# Patient Record
Sex: Male | Born: 1955 | Race: White | Hispanic: No | Marital: Married
Health system: Southern US, Community
[De-identification: ages and names within clinical notes are randomized; demographics above are authoritative.]

## PROBLEM LIST (undated history)

## (undated) DIAGNOSIS — E785 Hyperlipidemia, unspecified: Secondary | ICD-10-CM

## (undated) DIAGNOSIS — I10 Essential (primary) hypertension: Secondary | ICD-10-CM

## (undated) DIAGNOSIS — E669 Obesity, unspecified: Secondary | ICD-10-CM

## (undated) DIAGNOSIS — M549 Dorsalgia, unspecified: Secondary | ICD-10-CM

## (undated) DIAGNOSIS — G4731 Primary central sleep apnea: Secondary | ICD-10-CM

## (undated) DIAGNOSIS — E119 Type 2 diabetes mellitus without complications: Secondary | ICD-10-CM

## (undated) DIAGNOSIS — G4739 Other sleep apnea: Secondary | ICD-10-CM

## (undated) DIAGNOSIS — G43909 Migraine, unspecified, not intractable, without status migrainosus: Secondary | ICD-10-CM

## (undated) DIAGNOSIS — J189 Pneumonia, unspecified organism: Secondary | ICD-10-CM

## (undated) DIAGNOSIS — K219 Gastro-esophageal reflux disease without esophagitis: Secondary | ICD-10-CM

## (undated) HISTORY — DX: Dorsalgia, unspecified: M54.9

## (undated) HISTORY — PX: OTHER SURGICAL HISTORY: SHX169

## (undated) HISTORY — DX: Hyperlipidemia, unspecified: E78.5

## (undated) HISTORY — PX: LAMINECTOMY: SHX219

## (undated) HISTORY — DX: Obesity, unspecified: E66.9

## (undated) HISTORY — DX: Gastro-esophageal reflux disease without esophagitis: K21.9

## (undated) HISTORY — DX: Primary central sleep apnea: G47.31

## (undated) HISTORY — DX: Migraine, unspecified, not intractable, without status migrainosus: G43.909

## (undated) HISTORY — DX: Essential (primary) hypertension: I10

## (undated) HISTORY — DX: Pneumonia, unspecified organism: J18.9

## (undated) HISTORY — DX: Type 2 diabetes mellitus without complications: E11.9

## (undated) HISTORY — DX: Other sleep apnea: G47.39

---

## 2005-01-22 ENCOUNTER — Encounter: Admission: RE | Admit: 2005-01-22 | Discharge: 2005-01-22 | Payer: Self-pay | Admitting: Specialist

## 2010-11-03 ENCOUNTER — Encounter
Admission: RE | Admit: 2010-11-03 | Discharge: 2010-11-03 | Payer: Self-pay | Source: Home / Self Care | Attending: Specialist | Admitting: Specialist

## 2010-12-03 ENCOUNTER — Encounter: Payer: Self-pay | Admitting: Specialist

## 2011-02-02 ENCOUNTER — Other Ambulatory Visit (HOSPITAL_COMMUNITY): Payer: Self-pay

## 2011-02-02 ENCOUNTER — Ambulatory Visit (HOSPITAL_COMMUNITY)
Admission: RE | Admit: 2011-02-02 | Discharge: 2011-02-02 | Disposition: A | Discharge status: Home / Self Care | Payer: Managed Care, Other (non HMO) | Source: Ambulatory Visit | Attending: Specialist | Admitting: Specialist

## 2011-02-02 ENCOUNTER — Other Ambulatory Visit: Payer: Self-pay | Admitting: Specialist

## 2011-02-02 ENCOUNTER — Other Ambulatory Visit (HOSPITAL_COMMUNITY): Payer: Self-pay | Admitting: Specialist

## 2011-02-02 ENCOUNTER — Encounter (HOSPITAL_COMMUNITY): Payer: Managed Care, Other (non HMO)

## 2011-02-02 DIAGNOSIS — M48061 Spinal stenosis, lumbar region without neurogenic claudication: Secondary | ICD-10-CM

## 2011-02-02 DIAGNOSIS — Z01818 Encounter for other preprocedural examination: Secondary | ICD-10-CM

## 2011-02-02 DIAGNOSIS — Z01812 Encounter for preprocedural laboratory examination: Secondary | ICD-10-CM | POA: Insufficient documentation

## 2011-02-02 LAB — URINALYSIS, ROUTINE W REFLEX MICROSCOPIC
Glucose, UA: NEGATIVE mg/dL
Hgb urine dipstick: NEGATIVE
Specific Gravity, Urine: 1.036 — ABNORMAL HIGH (ref 1.005–1.030)

## 2011-02-02 LAB — COMPREHENSIVE METABOLIC PANEL
BUN: 14 mg/dL (ref 6–23)
CO2: 29 mEq/L (ref 19–32)
Calcium: 9.1 mg/dL (ref 8.4–10.5)
Chloride: 107 mEq/L (ref 96–112)
GFR calc Af Amer: >60 mL/min (ref >60)
Glucose, Bld: 118 mg/dL — ABNORMAL HIGH (ref 70–99)
Potassium: 4.2 mEq/L (ref 3.5–5.1)
Total Bilirubin: 0.1 mg/dL — ABNORMAL LOW (ref 0.3–1.2)
Total Protein: 6.6 g/dL (ref 6.0–8.3)

## 2011-02-02 LAB — PROTIME-INR: INR: 0.93 (ref 0.00–1.49)

## 2011-02-02 LAB — CBC
MCH: 29.2 pg (ref 26.0–34.0)
MCHC: 33.4 g/dL (ref 30.0–36.0)
MCV: 87.3 fL (ref 78.0–100.0)
Platelets: 268 10*3/uL (ref 150–400)
WBC: 8.5 10*3/uL (ref 4.0–10.5)

## 2011-02-02 LAB — SURGICAL PCR SCREEN: Staphylococcus aureus: NEGATIVE

## 2011-02-14 ENCOUNTER — Other Ambulatory Visit: Payer: Self-pay | Admitting: Specialist

## 2011-02-14 ENCOUNTER — Ambulatory Visit (HOSPITAL_COMMUNITY)
Admission: RE | Admit: 2011-02-14 | Discharge: 2011-02-14 | Disposition: A | Discharge status: Home / Self Care | Payer: Managed Care, Other (non HMO) | Source: Ambulatory Visit | Attending: Specialist | Admitting: Specialist

## 2011-02-14 ENCOUNTER — Ambulatory Visit (HOSPITAL_COMMUNITY): Payer: Managed Care, Other (non HMO)

## 2011-02-14 ENCOUNTER — Observation Stay (HOSPITAL_COMMUNITY)
Admission: RE | Admit: 2011-02-14 | Discharge: 2011-02-15 | Disposition: A | Discharge status: Home / Self Care | Payer: Managed Care, Other (non HMO) | Source: Ambulatory Visit | Attending: Specialist | Admitting: Specialist

## 2011-02-14 ENCOUNTER — Other Ambulatory Visit (HOSPITAL_COMMUNITY): Payer: Self-pay | Admitting: Specialist

## 2011-02-14 DIAGNOSIS — Z01818 Encounter for other preprocedural examination: Secondary | ICD-10-CM | POA: Insufficient documentation

## 2011-02-14 DIAGNOSIS — M5126 Other intervertebral disc displacement, lumbar region: Secondary | ICD-10-CM | POA: Insufficient documentation

## 2011-02-14 DIAGNOSIS — K219 Gastro-esophageal reflux disease without esophagitis: Secondary | ICD-10-CM | POA: Insufficient documentation

## 2011-02-14 DIAGNOSIS — Z9889 Other specified postprocedural states: Secondary | ICD-10-CM

## 2011-02-14 DIAGNOSIS — M48061 Spinal stenosis, lumbar region without neurogenic claudication: Principal | ICD-10-CM | POA: Insufficient documentation

## 2011-02-14 DIAGNOSIS — I10 Essential (primary) hypertension: Secondary | ICD-10-CM | POA: Insufficient documentation

## 2011-02-14 DIAGNOSIS — M79609 Pain in unspecified limb: Secondary | ICD-10-CM | POA: Insufficient documentation

## 2011-02-14 DIAGNOSIS — E669 Obesity, unspecified: Secondary | ICD-10-CM | POA: Insufficient documentation

## 2011-02-14 DIAGNOSIS — E785 Hyperlipidemia, unspecified: Secondary | ICD-10-CM | POA: Insufficient documentation

## 2011-02-28 NOTE — Op Note (Signed)
Raymond Powell, Raymond Powell               ACCOUNT NO.:  [id number]  MEDICAL RECORD NO.:  [id number]           PATIENT TYPE:  LOCATION:                                 FACILITY:  PHYSICIAN:  Jene Every, M.D.    DATE OF BIRTH:  [birth year]  DATE OF PROCEDURE:  02/14/2011 DATE OF DISCHARGE:                              OPERATIVE REPORT   PREOPERATIVE DIAGNOSES:  Spinal stenosis, herniated nucleus pulposus 4-5 bilaterally.  POSTOPERATIVE DIAGNOSES:  Spinal stenosis, herniated nucleus pulposus 4- 5 bilaterally.  PROCEDURE PERFORMED: 1. Bilateral hemilaminotomies, L4, L5. 2. Bilateral foraminotomies, L4, L5. 3. Microdiskectomy 4-5, left.  ANESTHESIA:  General.  ASSISTANT:  Roma Schanz, PA  BRIEF HISTORY:  A 55 year old with long history of left lower extremity radicular pain, right lower extremity radicular pain, second lateral recess stenosis, confirmed by myelogram.  Temporary relief from epidural steroid injection, positive neural tension sign, EHL weakness, indicated for decompression bilaterally.  Risks and benefits discussed, including bleeding, infection, damage to neurovascular structures, no change in symptoms, worsening symptoms, need for repeat debridement, DVT, PE, and anesthetic complications, etc.  TECHNIQUE:  With the patient in supine position after induction of adequate general anesthesia, 2 g of Kefzol, he was placed prone on the Deltana frame.  All bony prominences were well padded.  Lumbar region was prepped and draped in usual sterile fashion.  Two 18-gauge spinal needles were utilized to localize 4-5 interspace, confirmed with x-ray. Incision was made from spinous process 4-5, subcutaneous tissue was dissected, electrocautery was utilized to achieve hemostasis. Dorsolumbar fascia was divided on either line of the interspinous ligament.  Paraspinous muscle elevated from line of 4-5.  McCullough retractor was placed, from which one radiograph obtained.   There was a delay in the case due to the portal x-ray malfunctioning.  This was corrected and then we continued with the case.  Hemilaminotomy in the caudad edge at 4 was performed with 2 and 3 mm Kerrison on the left. Straight curette utilized to detach ligamentum flavum from the cephalad edge of 5.  Ligamentum flavum removed from the interspace with a Penfield 4 in the interlaminar space.  Severe lateral recess stenosis was noted multifactorial.  We decompressed lateral recess to medial border of the pedicle.  After we identified the L5 nerve root, gently mobilized this medially.  This was after a foraminotomy of 5 was performed as well. Lysis of adhesions was performed as well and a focal disk herniation was noted.  I performed annulotomy and then removed copious portion the herniated disk from the disk space.  Straight and an upbiting pituitary further mobilized with a hockey stick.  Foraminotomy of 4 was performed.  Hockey-stick probe passed freely up foramen of 4 and 5.  I checked beneath the thecal sac, the axilla root, shoulder root, foramen of 4 and 5, no residual disk herniation.  By approaching the root, we had at least 1 cm of excursion of the 5 root medial to pedicle without tension.  Disk space copiously irrigated with antibiotic irrigation.  Bipolar electrocautery was utilized to achieve hemostasis. We turned our attention to the right.  In  a similar fashion, we decompressed the lateral recess at L4-5 on the right.  There was no disk herniation.  We performed foraminotomies at 4 and 5, preserving the neural elements at all times.  Bipolar electrocautery was utilized to achieve hemostasis as well as adequate preservation of the pars bilaterally.  After the decompression on the right and foraminotomies, we checked of the left as well, no further herniated material.  Disk space was copiously irrigated, no evidence of CSF leakage or active bleeding.  Next, McCullough retractor  was placed after the [facility] 4 confirmed the interspace, the dorsolumbar fascia reapproximated with 1 Vicryl with interrupted figure-of-eight sutures, subcutaneous with 2-0 Vicryl simple sutures, and skin was reapproximated with 4-0 subcuticular Prolene.  Wound reinforced with Steri-Strips.  Sterile dressing applied. Placed in supine on hospital bed, extubated without difficulty, transported to the recovery room in satisfactory condition.  The patient tolerated procedure well.  No complications.     Jene Every, M.D.     Cordelia Pen  D:  02/14/2011  T:  02/15/2011  Job:  [id number]  Electronically Signed by Jene Every M.D. on 02/28/2011 12:04:56 PM

## 2011-06-10 NOTE — Discharge Summary (Signed)
  NAMEMATTEO, BANKE               ACCOUNT NO.:  [id number]  MEDICAL RECORD NO.:  [id number]  LOCATION:  1619                         FACILITY:  [facility]  PHYSICIAN:  Jene Every, M.D.    DATE OF BIRTH:  [birth year]  DATE OF ADMISSION:  02/14/2011 DATE OF DISCHARGE:  02/15/2011                              DISCHARGE SUMMARY   ADMISSION DIAGNOSIS:  Spinal stenosis, herniated nucleus pulposus L4-L5 bilaterally.  DISCHARGE DIAGNOSIS:  Spinal stenosis, herniated nucleus pulposus L4-L5 bilaterally, status post bilateral hemilaminotomies, foraminotomies at L4-L5 with microdiskectomy at L4-L5 on the left.  Hospital course was uneventful.  FOLLOWUP:  The patient will follow up with Dr. Shelle Iron in approximately 10 to 14 days.  Call for an appointment.  WOUND CARE:  Change dressing daily.  DIET:  As tolerated.  ACTIVITIES:  Walk as tolerated, utilizing back precautions.  CONDITION ON DISCHARGE:  Stable.  FINAL DIAGNOSIS:  Doing well status post lumbar decompression microdiskectomy at L4-L5.     Roma Schanz, P.A.   ______________________________ Jene Every, M.D.    CS/MEDQ  D:  05/28/2011  T:  05/28/2011  Job:  [id number]  Electronically Signed by Roma Schanz P.A. on 06/07/2011 02:59:27 PM Electronically Signed by Jene Every M.D. on 06/10/2011 09:49:53 AM

## 2012-04-16 DIAGNOSIS — M461 Sacroiliitis, not elsewhere classified: Secondary | ICD-10-CM

## 2012-04-16 DIAGNOSIS — M545 Low back pain, unspecified: Secondary | ICD-10-CM

## 2012-04-16 HISTORY — DX: Low back pain, unspecified: M54.50

## 2012-04-16 HISTORY — DX: Sacroiliitis, not elsewhere classified: M46.1

## 2012-06-17 DIAGNOSIS — G5700 Lesion of sciatic nerve, unspecified lower limb: Secondary | ICD-10-CM

## 2012-06-17 HISTORY — DX: Lesion of sciatic nerve, unspecified lower limb: G57.00

## 2012-10-01 DIAGNOSIS — M25519 Pain in unspecified shoulder: Secondary | ICD-10-CM | POA: Insufficient documentation

## 2012-10-01 DIAGNOSIS — M542 Cervicalgia: Secondary | ICD-10-CM

## 2012-10-01 DIAGNOSIS — G5690 Unspecified mononeuropathy of unspecified upper limb: Secondary | ICD-10-CM

## 2012-10-01 HISTORY — DX: Cervicalgia: M54.2

## 2012-10-01 HISTORY — DX: Unspecified mononeuropathy of unspecified upper limb: G56.90

## 2012-10-01 HISTORY — DX: Pain in unspecified shoulder: M25.519

## 2015-12-26 DIAGNOSIS — G894 Chronic pain syndrome: Secondary | ICD-10-CM | POA: Diagnosis not present

## 2015-12-26 DIAGNOSIS — Z79891 Long term (current) use of opiate analgesic: Secondary | ICD-10-CM | POA: Diagnosis not present

## 2015-12-26 DIAGNOSIS — M961 Postlaminectomy syndrome, not elsewhere classified: Secondary | ICD-10-CM | POA: Diagnosis not present

## 2015-12-26 DIAGNOSIS — Z79899 Other long term (current) drug therapy: Secondary | ICD-10-CM | POA: Diagnosis not present

## 2015-12-30 DIAGNOSIS — N200 Calculus of kidney: Secondary | ICD-10-CM | POA: Diagnosis not present

## 2016-01-02 DIAGNOSIS — N201 Calculus of ureter: Secondary | ICD-10-CM | POA: Diagnosis not present

## 2016-01-02 DIAGNOSIS — N200 Calculus of kidney: Secondary | ICD-10-CM | POA: Diagnosis not present

## 2016-01-03 DIAGNOSIS — N200 Calculus of kidney: Secondary | ICD-10-CM | POA: Diagnosis not present

## 2016-01-03 DIAGNOSIS — N201 Calculus of ureter: Secondary | ICD-10-CM | POA: Diagnosis not present

## 2016-01-04 DIAGNOSIS — R300 Dysuria: Secondary | ICD-10-CM | POA: Diagnosis not present

## 2016-01-04 DIAGNOSIS — N201 Calculus of ureter: Secondary | ICD-10-CM | POA: Diagnosis not present

## 2016-02-10 DIAGNOSIS — R7303 Prediabetes: Secondary | ICD-10-CM | POA: Diagnosis not present

## 2016-02-10 DIAGNOSIS — R21 Rash and other nonspecific skin eruption: Secondary | ICD-10-CM | POA: Diagnosis not present

## 2016-02-10 DIAGNOSIS — R632 Polyphagia: Secondary | ICD-10-CM | POA: Diagnosis not present

## 2016-03-20 DIAGNOSIS — G894 Chronic pain syndrome: Secondary | ICD-10-CM | POA: Diagnosis not present

## 2016-03-20 DIAGNOSIS — Z79891 Long term (current) use of opiate analgesic: Secondary | ICD-10-CM | POA: Diagnosis not present

## 2016-03-20 DIAGNOSIS — M961 Postlaminectomy syndrome, not elsewhere classified: Secondary | ICD-10-CM | POA: Diagnosis not present

## 2016-03-28 DIAGNOSIS — M961 Postlaminectomy syndrome, not elsewhere classified: Secondary | ICD-10-CM | POA: Diagnosis not present

## 2016-04-04 DIAGNOSIS — G894 Chronic pain syndrome: Secondary | ICD-10-CM | POA: Diagnosis not present

## 2016-04-04 DIAGNOSIS — M961 Postlaminectomy syndrome, not elsewhere classified: Secondary | ICD-10-CM | POA: Diagnosis not present

## 2016-04-04 DIAGNOSIS — Z79891 Long term (current) use of opiate analgesic: Secondary | ICD-10-CM | POA: Diagnosis not present

## 2016-06-18 DIAGNOSIS — E7800 Pure hypercholesterolemia, unspecified: Secondary | ICD-10-CM | POA: Diagnosis not present

## 2016-06-18 DIAGNOSIS — G43019 Migraine without aura, intractable, without status migrainosus: Secondary | ICD-10-CM | POA: Diagnosis not present

## 2016-06-18 DIAGNOSIS — I10 Essential (primary) hypertension: Secondary | ICD-10-CM | POA: Diagnosis not present

## 2016-06-18 DIAGNOSIS — K219 Gastro-esophageal reflux disease without esophagitis: Secondary | ICD-10-CM | POA: Diagnosis not present

## 2016-06-18 DIAGNOSIS — M5442 Lumbago with sciatica, left side: Secondary | ICD-10-CM | POA: Diagnosis not present

## 2016-07-19 DIAGNOSIS — E785 Hyperlipidemia, unspecified: Secondary | ICD-10-CM | POA: Diagnosis not present

## 2016-07-19 DIAGNOSIS — Z79899 Other long term (current) drug therapy: Secondary | ICD-10-CM | POA: Diagnosis not present

## 2016-07-30 DIAGNOSIS — M961 Postlaminectomy syndrome, not elsewhere classified: Secondary | ICD-10-CM | POA: Diagnosis not present

## 2016-07-30 DIAGNOSIS — Z79891 Long term (current) use of opiate analgesic: Secondary | ICD-10-CM | POA: Diagnosis not present

## 2016-07-30 DIAGNOSIS — G894 Chronic pain syndrome: Secondary | ICD-10-CM | POA: Diagnosis not present

## 2016-08-01 DIAGNOSIS — Z0000 Encounter for general adult medical examination without abnormal findings: Secondary | ICD-10-CM | POA: Diagnosis not present

## 2016-08-01 DIAGNOSIS — Z23 Encounter for immunization: Secondary | ICD-10-CM | POA: Diagnosis not present

## 2016-08-15 DIAGNOSIS — M961 Postlaminectomy syndrome, not elsewhere classified: Secondary | ICD-10-CM | POA: Diagnosis not present

## 2016-11-08 DIAGNOSIS — G894 Chronic pain syndrome: Secondary | ICD-10-CM | POA: Diagnosis not present

## 2016-11-08 DIAGNOSIS — M961 Postlaminectomy syndrome, not elsewhere classified: Secondary | ICD-10-CM | POA: Diagnosis not present

## 2016-11-08 DIAGNOSIS — Z79891 Long term (current) use of opiate analgesic: Secondary | ICD-10-CM | POA: Diagnosis not present

## 2016-12-04 DIAGNOSIS — M961 Postlaminectomy syndrome, not elsewhere classified: Secondary | ICD-10-CM | POA: Diagnosis not present

## 2016-12-22 DIAGNOSIS — M961 Postlaminectomy syndrome, not elsewhere classified: Secondary | ICD-10-CM | POA: Diagnosis not present

## 2016-12-22 DIAGNOSIS — G894 Chronic pain syndrome: Secondary | ICD-10-CM | POA: Diagnosis not present

## 2016-12-22 DIAGNOSIS — Z79891 Long term (current) use of opiate analgesic: Secondary | ICD-10-CM | POA: Diagnosis not present

## 2017-03-21 DIAGNOSIS — G894 Chronic pain syndrome: Secondary | ICD-10-CM | POA: Diagnosis not present

## 2017-03-21 DIAGNOSIS — Z79891 Long term (current) use of opiate analgesic: Secondary | ICD-10-CM | POA: Diagnosis not present

## 2017-03-21 DIAGNOSIS — M961 Postlaminectomy syndrome, not elsewhere classified: Secondary | ICD-10-CM | POA: Diagnosis not present

## 2017-05-20 DIAGNOSIS — K219 Gastro-esophageal reflux disease without esophagitis: Secondary | ICD-10-CM | POA: Diagnosis not present

## 2017-05-20 DIAGNOSIS — E7800 Pure hypercholesterolemia, unspecified: Secondary | ICD-10-CM | POA: Diagnosis not present

## 2017-05-20 DIAGNOSIS — E785 Hyperlipidemia, unspecified: Secondary | ICD-10-CM | POA: Diagnosis not present

## 2017-05-20 DIAGNOSIS — G8929 Other chronic pain: Secondary | ICD-10-CM | POA: Diagnosis not present

## 2017-05-20 DIAGNOSIS — R197 Diarrhea, unspecified: Secondary | ICD-10-CM | POA: Diagnosis not present

## 2017-05-20 DIAGNOSIS — J181 Lobar pneumonia, unspecified organism: Secondary | ICD-10-CM | POA: Diagnosis not present

## 2017-05-20 DIAGNOSIS — Z885 Allergy status to narcotic agent status: Secondary | ICD-10-CM | POA: Diagnosis not present

## 2017-05-20 DIAGNOSIS — Z79899 Other long term (current) drug therapy: Secondary | ICD-10-CM | POA: Diagnosis not present

## 2017-05-20 DIAGNOSIS — A419 Sepsis, unspecified organism: Secondary | ICD-10-CM | POA: Diagnosis not present

## 2017-05-20 DIAGNOSIS — J189 Pneumonia, unspecified organism: Secondary | ICD-10-CM | POA: Diagnosis not present

## 2017-05-20 DIAGNOSIS — I10 Essential (primary) hypertension: Secondary | ICD-10-CM | POA: Diagnosis not present

## 2017-05-20 DIAGNOSIS — R1084 Generalized abdominal pain: Secondary | ICD-10-CM | POA: Diagnosis not present

## 2017-05-20 DIAGNOSIS — M545 Low back pain: Secondary | ICD-10-CM | POA: Diagnosis not present

## 2017-05-20 DIAGNOSIS — R0902 Hypoxemia: Secondary | ICD-10-CM | POA: Diagnosis not present

## 2017-05-20 DIAGNOSIS — Z7982 Long term (current) use of aspirin: Secondary | ICD-10-CM | POA: Diagnosis not present

## 2017-05-20 DIAGNOSIS — A045 Campylobacter enteritis: Secondary | ICD-10-CM | POA: Diagnosis not present

## 2017-05-20 DIAGNOSIS — R109 Unspecified abdominal pain: Secondary | ICD-10-CM | POA: Diagnosis not present

## 2017-05-21 DIAGNOSIS — M545 Low back pain: Secondary | ICD-10-CM | POA: Diagnosis not present

## 2017-05-21 DIAGNOSIS — J189 Pneumonia, unspecified organism: Secondary | ICD-10-CM | POA: Diagnosis not present

## 2017-05-21 DIAGNOSIS — R109 Unspecified abdominal pain: Secondary | ICD-10-CM | POA: Diagnosis not present

## 2017-05-21 DIAGNOSIS — R0902 Hypoxemia: Secondary | ICD-10-CM | POA: Diagnosis not present

## 2017-05-21 DIAGNOSIS — E785 Hyperlipidemia, unspecified: Secondary | ICD-10-CM | POA: Diagnosis not present

## 2017-05-21 DIAGNOSIS — R197 Diarrhea, unspecified: Secondary | ICD-10-CM | POA: Diagnosis not present

## 2017-05-21 DIAGNOSIS — I10 Essential (primary) hypertension: Secondary | ICD-10-CM | POA: Diagnosis not present

## 2017-06-04 DIAGNOSIS — A045 Campylobacter enteritis: Secondary | ICD-10-CM | POA: Diagnosis not present

## 2017-06-04 DIAGNOSIS — J189 Pneumonia, unspecified organism: Secondary | ICD-10-CM | POA: Diagnosis not present

## 2017-06-04 DIAGNOSIS — J9601 Acute respiratory failure with hypoxia: Secondary | ICD-10-CM | POA: Diagnosis not present

## 2017-06-04 DIAGNOSIS — K219 Gastro-esophageal reflux disease without esophagitis: Secondary | ICD-10-CM | POA: Diagnosis not present

## 2017-06-04 DIAGNOSIS — E860 Dehydration: Secondary | ICD-10-CM | POA: Diagnosis not present

## 2017-07-23 DIAGNOSIS — G894 Chronic pain syndrome: Secondary | ICD-10-CM | POA: Diagnosis not present

## 2017-07-23 DIAGNOSIS — M961 Postlaminectomy syndrome, not elsewhere classified: Secondary | ICD-10-CM | POA: Diagnosis not present

## 2017-08-14 DIAGNOSIS — K219 Gastro-esophageal reflux disease without esophagitis: Secondary | ICD-10-CM | POA: Diagnosis not present

## 2017-08-14 DIAGNOSIS — Z131 Encounter for screening for diabetes mellitus: Secondary | ICD-10-CM | POA: Diagnosis not present

## 2017-08-14 DIAGNOSIS — E785 Hyperlipidemia, unspecified: Secondary | ICD-10-CM | POA: Diagnosis not present

## 2017-08-14 DIAGNOSIS — Z79899 Other long term (current) drug therapy: Secondary | ICD-10-CM | POA: Diagnosis not present

## 2017-08-14 DIAGNOSIS — Z23 Encounter for immunization: Secondary | ICD-10-CM | POA: Diagnosis not present

## 2017-08-14 DIAGNOSIS — Z0000 Encounter for general adult medical examination without abnormal findings: Secondary | ICD-10-CM | POA: Diagnosis not present

## 2017-08-14 DIAGNOSIS — Z125 Encounter for screening for malignant neoplasm of prostate: Secondary | ICD-10-CM | POA: Diagnosis not present

## 2017-08-14 DIAGNOSIS — C61 Malignant neoplasm of prostate: Secondary | ICD-10-CM | POA: Diagnosis not present

## 2017-08-22 DIAGNOSIS — G894 Chronic pain syndrome: Secondary | ICD-10-CM | POA: Diagnosis not present

## 2017-08-22 DIAGNOSIS — Z79891 Long term (current) use of opiate analgesic: Secondary | ICD-10-CM | POA: Diagnosis not present

## 2017-08-22 DIAGNOSIS — M961 Postlaminectomy syndrome, not elsewhere classified: Secondary | ICD-10-CM | POA: Diagnosis not present

## 2017-09-13 DIAGNOSIS — M25511 Pain in right shoulder: Secondary | ICD-10-CM | POA: Diagnosis not present

## 2017-11-18 DIAGNOSIS — M961 Postlaminectomy syndrome, not elsewhere classified: Secondary | ICD-10-CM | POA: Insufficient documentation

## 2017-11-18 DIAGNOSIS — Z79899 Other long term (current) drug therapy: Secondary | ICD-10-CM

## 2017-11-18 HISTORY — DX: Postlaminectomy syndrome, not elsewhere classified: M96.1

## 2017-11-18 HISTORY — DX: Other long term (current) drug therapy: Z79.899

## 2017-11-21 DIAGNOSIS — Z79899 Other long term (current) drug therapy: Secondary | ICD-10-CM | POA: Diagnosis not present

## 2017-11-21 DIAGNOSIS — M961 Postlaminectomy syndrome, not elsewhere classified: Secondary | ICD-10-CM | POA: Diagnosis not present

## 2017-11-21 DIAGNOSIS — M25512 Pain in left shoulder: Secondary | ICD-10-CM | POA: Insufficient documentation

## 2017-11-21 DIAGNOSIS — M5412 Radiculopathy, cervical region: Secondary | ICD-10-CM

## 2017-11-21 DIAGNOSIS — M25511 Pain in right shoulder: Secondary | ICD-10-CM

## 2017-11-21 HISTORY — DX: Pain in right shoulder: M25.511

## 2017-11-21 HISTORY — DX: Radiculopathy, cervical region: M54.12

## 2017-12-05 DIAGNOSIS — M5412 Radiculopathy, cervical region: Secondary | ICD-10-CM | POA: Diagnosis not present

## 2017-12-17 DIAGNOSIS — Z1331 Encounter for screening for depression: Secondary | ICD-10-CM | POA: Diagnosis not present

## 2017-12-17 DIAGNOSIS — Z0000 Encounter for general adult medical examination without abnormal findings: Secondary | ICD-10-CM | POA: Diagnosis not present

## 2017-12-17 DIAGNOSIS — Z1339 Encounter for screening examination for other mental health and behavioral disorders: Secondary | ICD-10-CM | POA: Diagnosis not present

## 2017-12-24 DIAGNOSIS — M5412 Radiculopathy, cervical region: Secondary | ICD-10-CM | POA: Diagnosis not present

## 2017-12-31 DIAGNOSIS — M5412 Radiculopathy, cervical region: Secondary | ICD-10-CM | POA: Diagnosis not present

## 2018-03-19 DIAGNOSIS — M5412 Radiculopathy, cervical region: Secondary | ICD-10-CM | POA: Diagnosis not present

## 2018-04-24 DIAGNOSIS — M5441 Lumbago with sciatica, right side: Secondary | ICD-10-CM | POA: Diagnosis not present

## 2018-04-24 DIAGNOSIS — M5442 Lumbago with sciatica, left side: Secondary | ICD-10-CM | POA: Diagnosis not present

## 2018-04-24 DIAGNOSIS — E119 Type 2 diabetes mellitus without complications: Secondary | ICD-10-CM | POA: Diagnosis not present

## 2018-04-24 DIAGNOSIS — K219 Gastro-esophageal reflux disease without esophagitis: Secondary | ICD-10-CM | POA: Diagnosis not present

## 2018-04-24 DIAGNOSIS — I10 Essential (primary) hypertension: Secondary | ICD-10-CM | POA: Diagnosis not present

## 2018-04-29 DIAGNOSIS — E119 Type 2 diabetes mellitus without complications: Secondary | ICD-10-CM | POA: Diagnosis not present

## 2018-06-11 DIAGNOSIS — H2513 Age-related nuclear cataract, bilateral: Secondary | ICD-10-CM | POA: Diagnosis not present

## 2018-07-22 DIAGNOSIS — Z79899 Other long term (current) drug therapy: Secondary | ICD-10-CM | POA: Diagnosis not present

## 2018-08-26 DIAGNOSIS — Z23 Encounter for immunization: Secondary | ICD-10-CM | POA: Diagnosis not present

## 2018-09-04 DIAGNOSIS — K219 Gastro-esophageal reflux disease without esophagitis: Secondary | ICD-10-CM | POA: Diagnosis not present

## 2018-09-04 DIAGNOSIS — E7800 Pure hypercholesterolemia, unspecified: Secondary | ICD-10-CM | POA: Diagnosis not present

## 2018-09-04 DIAGNOSIS — E119 Type 2 diabetes mellitus without complications: Secondary | ICD-10-CM | POA: Diagnosis not present

## 2018-09-24 DIAGNOSIS — L820 Inflamed seborrheic keratosis: Secondary | ICD-10-CM | POA: Diagnosis not present

## 2018-09-24 DIAGNOSIS — B079 Viral wart, unspecified: Secondary | ICD-10-CM | POA: Diagnosis not present

## 2018-09-24 DIAGNOSIS — L578 Other skin changes due to chronic exposure to nonionizing radiation: Secondary | ICD-10-CM | POA: Diagnosis not present

## 2018-11-27 DIAGNOSIS — Z79899 Other long term (current) drug therapy: Secondary | ICD-10-CM | POA: Diagnosis not present

## 2018-11-27 DIAGNOSIS — M5412 Radiculopathy, cervical region: Secondary | ICD-10-CM | POA: Diagnosis not present

## 2018-11-27 DIAGNOSIS — M961 Postlaminectomy syndrome, not elsewhere classified: Secondary | ICD-10-CM | POA: Diagnosis not present

## 2019-01-13 DIAGNOSIS — E119 Type 2 diabetes mellitus without complications: Secondary | ICD-10-CM | POA: Diagnosis not present

## 2019-01-13 DIAGNOSIS — G43019 Migraine without aura, intractable, without status migrainosus: Secondary | ICD-10-CM | POA: Diagnosis not present

## 2019-01-13 DIAGNOSIS — E785 Hyperlipidemia, unspecified: Secondary | ICD-10-CM | POA: Diagnosis not present

## 2019-01-13 DIAGNOSIS — K219 Gastro-esophageal reflux disease without esophagitis: Secondary | ICD-10-CM | POA: Diagnosis not present

## 2019-01-13 DIAGNOSIS — I10 Essential (primary) hypertension: Secondary | ICD-10-CM | POA: Diagnosis not present

## 2019-01-13 DIAGNOSIS — Z0000 Encounter for general adult medical examination without abnormal findings: Secondary | ICD-10-CM | POA: Diagnosis not present

## 2019-01-13 DIAGNOSIS — Z79899 Other long term (current) drug therapy: Secondary | ICD-10-CM | POA: Diagnosis not present

## 2019-03-26 DIAGNOSIS — G894 Chronic pain syndrome: Secondary | ICD-10-CM | POA: Diagnosis not present

## 2019-03-26 DIAGNOSIS — M545 Low back pain: Secondary | ICD-10-CM | POA: Diagnosis not present

## 2019-07-16 DIAGNOSIS — I10 Essential (primary) hypertension: Secondary | ICD-10-CM | POA: Diagnosis not present

## 2019-07-16 DIAGNOSIS — E119 Type 2 diabetes mellitus without complications: Secondary | ICD-10-CM | POA: Diagnosis not present

## 2019-07-16 DIAGNOSIS — E7800 Pure hypercholesterolemia, unspecified: Secondary | ICD-10-CM | POA: Diagnosis not present

## 2019-07-16 DIAGNOSIS — M5442 Lumbago with sciatica, left side: Secondary | ICD-10-CM | POA: Diagnosis not present

## 2019-07-29 DIAGNOSIS — M961 Postlaminectomy syndrome, not elsewhere classified: Secondary | ICD-10-CM | POA: Diagnosis not present

## 2019-07-29 DIAGNOSIS — Z5181 Encounter for therapeutic drug level monitoring: Secondary | ICD-10-CM | POA: Diagnosis not present

## 2019-07-29 DIAGNOSIS — M5412 Radiculopathy, cervical region: Secondary | ICD-10-CM | POA: Diagnosis not present

## 2019-07-29 DIAGNOSIS — Z79899 Other long term (current) drug therapy: Secondary | ICD-10-CM | POA: Diagnosis not present

## 2019-09-02 DIAGNOSIS — H2513 Age-related nuclear cataract, bilateral: Secondary | ICD-10-CM | POA: Diagnosis not present

## 2019-11-23 DIAGNOSIS — R002 Palpitations: Secondary | ICD-10-CM | POA: Diagnosis not present

## 2019-11-23 DIAGNOSIS — E119 Type 2 diabetes mellitus without complications: Secondary | ICD-10-CM | POA: Diagnosis not present

## 2019-11-26 DIAGNOSIS — Z79899 Other long term (current) drug therapy: Secondary | ICD-10-CM | POA: Diagnosis not present

## 2019-11-26 DIAGNOSIS — M5412 Radiculopathy, cervical region: Secondary | ICD-10-CM | POA: Diagnosis not present

## 2019-11-26 DIAGNOSIS — M961 Postlaminectomy syndrome, not elsewhere classified: Secondary | ICD-10-CM | POA: Diagnosis not present

## 2019-12-11 DIAGNOSIS — R002 Palpitations: Secondary | ICD-10-CM | POA: Diagnosis not present

## 2019-12-12 ENCOUNTER — Encounter: Payer: Self-pay | Admitting: Cardiology

## 2019-12-12 NOTE — Progress Notes (Signed)
Cardiology Office Note:    Date:  12/14/2019   ID:  Raymond Powell, DOB December 25, 1955, MRN [id number]  PCP:  Serita Grammes, MD  Cardiologist:  Shirlee More, MD   Referring MD: Serita Grammes, MD  ASSESSMENT:    1. Palpitations   2. Hyperlipidemia, unspecified hyperlipidemia type   3. Migraine without status migrainosus, not intractable, unspecified migraine type    PLAN:    In order of problems listed above:  1. I am still awaiting the results of his monitor I would leave this note open I expect to receive it with the next 24 hours and told the patient I would call him and will finish the office visit at that time.  The treatment depends on precise delineation of arrhythmia if present in which type patient he is having atrial fibrillation.  If present would be low stroke risk based on age  I received his monitor reviewed independently he wore it for 9 days 20 hours beginning 11/23/2019.  He had documented atrial fibrillation 7% burden longest episode 9 hours 6 minutes daytime heart rates tended to be rapid 61% of the time above 110 bpm.  Ventricular arrhythmia was rare with PVCs couplets.  There was an episode called ventricular tachycardia but was rapid atrial fibrillation.  We discussed on the phone on when to start him on low-dose flecainide 50 mg twice daily stop aspirin start Eliquis 5 mg twice daily I will ask him to have an echocardiogram performed and will move his follow-up in the office to 1 week.  Will need EKG at that time.  2. Continue statin 3. Continue beta-blocker at this time  Next appointment 6 weeks   Medication Adjustments/Labs and Tests Ordered: Current medicines are reviewed at length with the patient today.  Concerns regarding medicines are outlined above.  No orders of the defined types were placed in this encounter.  No orders of the defined types were placed in this encounter.    Chief Complaint  Patient presents with  . Palpitations    History  of Present Illness:    Raymond Powell is a 64 y.o. male who is being seen today for the evaluation of palpitation at the request of Serita Grammes, MD.  Recently he has had worsened palpitation.  About 1 time a week he gets episodes where his heart beats irregularly and makes him feel little breathless and afterwards he feels weak and the episodes last for hours at a time up to 12 hours.  Recently these episodes have been worsened saw his primary care physician he wore an ambulatory heart rhythm monitor for 10 days results pending.  Put a blood pressure cuff on and the rate is not rapid but he gets a signal that is irregular and he is on a beta-blocker for migraine prophylaxis.  His mother had atrial fibrillation and stroke.  He has no history of heart murmur congenital rheumatic heart disease and has no document arrhythmia.  He does use over-the-counter sinus allergy medications but does not think the symptoms are related.  Stroke or TIA. Past Medical History:  Diagnosis Date  . Back pain   . GERD without esophagitis   . Hyperlipidemia   . Hypertension   . Migraine   . Obesity   . Pneumonia   . Type 2 diabetes mellitus ([place])     Past Surgical History:  Procedure Laterality Date  . Arthroscopic knee surgery    . LAMINECTOMY      Current Medications: Current  Meds  Medication Sig  . aspirin 81 MG EC tablet Take by mouth.  Marland Kitchen atenolol (TENORMIN) 50 MG tablet atenolol 50 mg tablet  . atorvastatin (LIPITOR) 40 MG tablet atorvastatin 40 mg tablet  . meloxicam (MOBIC) 15 MG tablet meloxicam 15 mg tablet  . oxyCODONE-acetaminophen (PERCOCET/ROXICET) 5-325 MG tablet Take by mouth.     Allergies:   Codeine   Social History   Socioeconomic History  . Marital status: Married    Spouse name: Not on file  . Number of children: Not on file  . Years of education: Not on file  . Highest education level: Not on file  Occupational History  . Not on file  Tobacco Use  . Smoking status:  Former Smoker    Quit date: 12/11/1988    Years since quitting: 31.0  . Smokeless tobacco: Never Used  Substance and Sexual Activity  . Alcohol use: Never  . Drug use: Never  . Sexual activity: Yes  Other Topics Concern  . Not on file  Social History Narrative  . Not on file   Social Determinants of Health   Financial Resource Strain:   . Difficulty of Paying Living Expenses: Not on file  Food Insecurity:   . Worried About Charity fundraiser in the Last Year: Not on file  . Ran Out of Food in the Last Year: Not on file  Transportation Needs:   . Lack of Transportation (Medical): Not on file  . Lack of Transportation (Non-Medical): Not on file  Physical Activity:   . Days of Exercise per Week: Not on file  . Minutes of Exercise per Session: Not on file  Stress:   . Feeling of Stress : Not on file  Social Connections:   . Frequency of Communication with Friends and Family: Not on file  . Frequency of Social Gatherings with Friends and Family: Not on file  . Attends Religious Services: Not on file  . Active Member of Clubs or Organizations: Not on file  . Attends Archivist Meetings: Not on file  . Marital Status: Not on file     Family History: The patient's family history includes CAD in his mother; Ovarian cancer in his sister; Pancreatic cancer in his brother; Stroke in his mother.  ROS:   Review of Systems  Constitution: Negative.  HENT: Negative.   Eyes: Negative.   Cardiovascular: Positive for irregular heartbeat and palpitations.  Respiratory: Negative.   Endocrine: Negative.   Hematologic/Lymphatic: Negative.   Skin: Positive for rash.  Musculoskeletal: Positive for back pain.  Gastrointestinal: Negative.   Genitourinary: Negative.   Neurological: Positive for headaches.  Psychiatric/Behavioral: Negative.   Allergic/Immunologic: Negative.    Please see the history of present illness.    He had a rash where the monitor was applied to his chest  all other systems reviewed and are negative.  EKGs/Labs/Other Studies Reviewed:    The following studies were reviewed today:  EKG:  EKG 11/22/2018 reviewed and demonstrates sinus rhythm left axis deviation low voltage precordial leads normal QT interval  Recent Labs: 01/13/2019: Cholesterol 204 HDL 46 LDL 132 A1c 6.5% TSH normal  Physical Exam:    VS:  BP 118/90   Pulse 79   Ht 6\' 1"  (1.854 m)   Wt 283 lb (128.4 kg)   SpO2 95%   BMI 37.34 kg/m     Wt Readings from Last 3 Encounters:  12/14/19 283 lb (128.4 kg)     GEN: Obese well  nourished, well developed in no acute distress HEENT: Normal NECK: No JVD; No carotid bruits LYMPHATICS: No lymphadenopathy CARDIAC: RRR, no murmurs, rubs, gallops RESPIRATORY:  Clear to auscultation without rales, wheezing or rhonchi  ABDOMEN: Soft, non-tender, non-distended MUSCULOSKELETAL:  No edema; No deformity  SKIN: Warm and dry NEUROLOGIC:  Alert and oriented x 3 PSYCHIATRIC:  Normal affect     Signed, Shirlee More, MD  12/14/2019 3:42 PM    [place]

## 2019-12-14 ENCOUNTER — Ambulatory Visit (INDEPENDENT_AMBULATORY_CARE_PROVIDER_SITE_OTHER): Payer: Medicare Other | Admitting: Cardiology

## 2019-12-14 ENCOUNTER — Other Ambulatory Visit: Payer: Self-pay

## 2019-12-14 ENCOUNTER — Encounter: Payer: Self-pay | Admitting: Cardiology

## 2019-12-14 VITALS — BP 118/90 | HR 79 | Ht 73.0 in | Wt 283.0 lb

## 2019-12-14 DIAGNOSIS — G43909 Migraine, unspecified, not intractable, without status migrainosus: Secondary | ICD-10-CM

## 2019-12-14 DIAGNOSIS — E785 Hyperlipidemia, unspecified: Secondary | ICD-10-CM | POA: Diagnosis not present

## 2019-12-14 DIAGNOSIS — I480 Paroxysmal atrial fibrillation: Secondary | ICD-10-CM

## 2019-12-14 DIAGNOSIS — R002 Palpitations: Secondary | ICD-10-CM | POA: Diagnosis not present

## 2019-12-14 DIAGNOSIS — R9431 Abnormal electrocardiogram [ECG] [EKG]: Secondary | ICD-10-CM

## 2019-12-14 MED ORDER — APIXABAN 5 MG PO TABS: 5.0000 mg | ORAL_TABLET | Freq: Two times a day (BID) | ORAL | 3 refills | Status: DC

## 2019-12-14 MED ORDER — FLECAINIDE ACETATE 50 MG PO TABS
50.0000 mg | ORAL_TABLET | Freq: Two times a day (BID) | ORAL | 3 refills | Status: DC
Start: 2019-12-14 — End: 2020-02-01

## 2019-12-14 NOTE — Patient Instructions (Signed)
Medication Instructions:  Your physician recommends that you continue on your current medications as directed. Please refer to the Current Medication list given to you today.  *If you need a refill on your cardiac medications before your next appointment, please call your pharmacy*  Lab Work: NONE If you have labs (blood work) drawn today and your tests are completely normal, you will receive your results only by: Marland Kitchen MyChart Message (if you have MyChart) OR . A paper copy in the mail If you have any lab test that is abnormal or we need to change your treatment, we will call you to review the results.  Testing/Procedures: NONE  Follow-Up: At [facility], you and your health needs are our priority.  As part of our continuing mission to provide you with exceptional heart care, we have created designated Provider Care Teams.  These Care Teams include your primary Cardiologist (physician) and Advanced Practice Providers (APPs -  Physician Assistants and Nurse Practitioners) who all work together to provide you with the care you need, when you need it.  Your next appointment:   6 week(s)  The format for your next appointment:   In Person  Provider:   Shirlee More, MD

## 2019-12-15 NOTE — Addendum Note (Signed)
Addended by: Beckey Rutter on: 12/15/2019 09:43 AM   Modules accepted: Orders

## 2019-12-16 ENCOUNTER — Other Ambulatory Visit (HOSPITAL_BASED_OUTPATIENT_CLINIC_OR_DEPARTMENT_OTHER): Payer: Medicare Other

## 2019-12-17 ENCOUNTER — Ambulatory Visit (HOSPITAL_BASED_OUTPATIENT_CLINIC_OR_DEPARTMENT_OTHER)
Admission: RE | Admit: 2019-12-17 | Discharge: 2019-12-17 | Disposition: A | Discharge status: Home / Self Care | Payer: Medicare Other | Source: Ambulatory Visit | Attending: Cardiology | Admitting: Cardiology

## 2019-12-17 ENCOUNTER — Other Ambulatory Visit: Payer: Self-pay

## 2019-12-17 DIAGNOSIS — R9431 Abnormal electrocardiogram [ECG] [EKG]: Secondary | ICD-10-CM | POA: Insufficient documentation

## 2019-12-17 DIAGNOSIS — I480 Paroxysmal atrial fibrillation: Secondary | ICD-10-CM | POA: Insufficient documentation

## 2019-12-17 DIAGNOSIS — R002 Palpitations: Secondary | ICD-10-CM | POA: Diagnosis not present

## 2019-12-17 DIAGNOSIS — E785 Hyperlipidemia, unspecified: Secondary | ICD-10-CM | POA: Insufficient documentation

## 2019-12-17 NOTE — Progress Notes (Signed)
  Echocardiogram 2D Echocardiogram has been performed.  Raymond Powell 12/17/2019, 2:48 PM

## 2019-12-17 NOTE — Progress Notes (Signed)
  Echocardiogram 2D Echocardiogram has been performed.  Rene Lardy 12/17/2019, 2:48 PM

## 2019-12-21 DIAGNOSIS — E119 Type 2 diabetes mellitus without complications: Secondary | ICD-10-CM | POA: Diagnosis not present

## 2019-12-21 DIAGNOSIS — I4891 Unspecified atrial fibrillation: Secondary | ICD-10-CM | POA: Diagnosis not present

## 2020-01-31 NOTE — Progress Notes (Signed)
Cardiology Office Note:    Date:  02/01/2020   ID:  Raymond Powell, DOB July 17, 1956, MRN [id number]  PCP:  Serita Grammes, MD  Cardiologist:  Shirlee More, MD    Referring MD: Serita Grammes, MD    ASSESSMENT:    1. High risk medication use   2. PAF (paroxysmal atrial fibrillation) ([place])   3. Chronic anticoagulation    PLAN:    In order of problems listed above:  1. Flecainide low-dose continue the same we will do an EKG to screen for toxicity before leaving the office continue his anticoagulant beta-blocker.   Next appointment: 6Months   Medication Adjustments/Labs and Tests Ordered: Current medicines are reviewed at length with the patient today.  Concerns regarding medicines are outlined above.  Orders Placed This Encounter  Procedures  . EKG 12-Lead   No orders of the defined types were placed in this encounter.   Chief Complaint  Patient presents with  . Atrial Fibrillation    Start on flecainide and anticoagulant    History of Present Illness:    Raymond Powell is a 64 y.o. male with a hx of asthma and atrial fibrillation last seen 12/14/2019.  He was initiated on low-dose flecainide 50 mg twice daily and transition from aspirin to full dose Eliquis.  12/17/2019 showed ejection fraction 60 to 65% right ventricular function both atria were normal in size and there was no significant valvular abnormality. Compliance with diet, lifestyle and medications: Yes  In general he is doing well he had one brief episode of tachycardia but was only for 1 minute not sustained he tolerates his anticoagulant without bleeding no edema shortness of breath chest pain or syncope. Past Medical History:  Diagnosis Date  . Back pain   . Cervical radiculopathy 11/21/2017  . GERD without esophagitis   . Hyperlipidemia   . Hypertension   . Low back pain 04/16/2012  . Lumbar post-laminectomy syndrome 11/18/2017  . Migraine   . Neck pain 10/01/2012  . 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 Obesity   . On long term drug therapy 11/18/2017  . Pain of right shoulder joint on movement 11/21/2017  . Piriformis syndrome 06/17/2012  . Pneumonia   . Sacroiliitis, not elsewhere classified ([place]) 04/16/2012  . Shoulder pain 10/01/2012  . Type 2 diabetes mellitus ([place])     Past Surgical History:  Procedure Laterality Date  . Arthroscopic knee surgery    . LAMINECTOMY      Current Medications: Current Meds  Medication Sig  . apixaban (ELIQUIS) 5 MG TABS tablet Take 1 tablet (5 mg total) by mouth 2 (two) times daily.  Marland Kitchen atenolol (TENORMIN) 50 MG tablet atenolol 50 mg tablet  . atorvastatin (LIPITOR) 40 MG tablet atorvastatin 40 mg tablet  . flecainide (TAMBOCOR) 50 MG tablet Take 1 tablet (50 mg total) by mouth 2 (two) times daily.  . meloxicam (MOBIC) 15 MG tablet meloxicam 15 mg tablet  . oxyCODONE-acetaminophen (PERCOCET) 10-325 MG tablet Take 1 tablet by mouth 5 (five) times daily as needed.     Allergies:   Codeine   Social History   Socioeconomic History  . Marital status: Married    Spouse name: Not on file  . Number of children: Not on file  . Years of education: Not on file  . Highest education level: Not on file  Occupational History  . Not on file  Tobacco Use  . Smoking status: Former Smoker    Quit date: 12/11/1988  Years since quitting: 31.1  . Smokeless tobacco: Never Used  Substance and Sexual Activity  . Alcohol use: Never  . Drug use: Never  . Sexual activity: Yes  Other Topics Concern  . Not on file  Social History Narrative  . Not on file   Social Determinants of Health   Financial Resource Strain:   . Difficulty of Paying Living Expenses:   Food Insecurity:   . Worried About Charity fundraiser in the Last Year:   . Arboriculturist in the Last Year:   Transportation Needs:   . Film/video editor (Medical):   Marland Kitchen Lack of Transportation (Non-Medical):   Physical Activity:   . Days of Exercise per Week:   .  Minutes of Exercise per Session:   Stress:   . Feeling of Stress :   Social Connections:   . Frequency of Communication with Friends and Family:   . Frequency of Social Gatherings with Friends and Family:   . Attends Religious Services:   . Active Member of Clubs or Organizations:   . Attends Archivist Meetings:   Marland Kitchen Marital Status:      Family History: The patient's family history includes CAD in his mother; Ovarian cancer in his sister; Pancreatic cancer in his brother; Stroke in his mother. ROS:   Please see the history of present illness.    All other systems reviewed and are negative.  EKGs/Labs/Other Studies Reviewed:    The following studies were reviewed today:  EKG:  EKG ordered today and personally reviewed.  The ekg ordered today demonstrates sinus rhythm normal EKG except for left axis deviation  Recent Labs: No results found for requested labs within last 8760 hours.  Recent Lipid Panel No results found for: CHOL, TRIG, HDL, CHOLHDL, VLDL, LDLCALC, LDLDIRECT  Physical Exam:    VS:  BP 114/80   Pulse 95   Ht 6\' 1"  (D34-534 m)   Wt 283 lb (128.4 kg)   SpO2 95%   BMI 37.34 kg/m     Wt Readings from Last 3 Encounters:  02/01/20 283 lb (128.4 kg)  12/14/19 283 lb (128.4 kg)     GEN:  Well nourished, well developed in no acute distress HEENT: Normal NECK: No JVD; No carotid bruits LYMPHATICS: No lymphadenopathy CARDIAC: RRR, no murmurs, rubs, gallops RESPIRATORY:  Clear to auscultation without rales, wheezing or rhonchi  ABDOMEN: Soft, non-tender, non-distended MUSCULOSKELETAL:  No edema; No deformity  SKIN: Warm and dry NEUROLOGIC:  Alert and oriented x 3 PSYCHIATRIC:  Normal affect    Signed, Shirlee More, MD  02/01/2020 3:43 PM    [facility]

## 2020-02-01 ENCOUNTER — Encounter: Payer: Self-pay | Admitting: Cardiology

## 2020-02-01 ENCOUNTER — Other Ambulatory Visit: Payer: Self-pay

## 2020-02-01 ENCOUNTER — Ambulatory Visit: Payer: Medicare Other | Admitting: Cardiology

## 2020-02-01 VITALS — BP 114/80 | HR 95 | Ht 73.0 in | Wt 283.0 lb

## 2020-02-01 DIAGNOSIS — I480 Paroxysmal atrial fibrillation: Secondary | ICD-10-CM | POA: Diagnosis not present

## 2020-02-01 DIAGNOSIS — Z7901 Long term (current) use of anticoagulants: Secondary | ICD-10-CM

## 2020-02-01 DIAGNOSIS — R002 Palpitations: Secondary | ICD-10-CM | POA: Diagnosis not present

## 2020-02-01 DIAGNOSIS — Z79899 Other long term (current) drug therapy: Secondary | ICD-10-CM

## 2020-02-01 MED ORDER — ATENOLOL 50 MG PO TABS: 50.0000 mg | ORAL_TABLET | Freq: Two times a day (BID) | ORAL | 3 refills | Status: DC

## 2020-02-01 MED ORDER — ATORVASTATIN CALCIUM 40 MG PO TABS: ORAL_TABLET | ORAL | 3 refills | Status: DC

## 2020-02-01 MED ORDER — APIXABAN 5 MG PO TABS: 5.0000 mg | ORAL_TABLET | Freq: Two times a day (BID) | ORAL | 3 refills | Status: DC

## 2020-02-01 MED ORDER — FLECAINIDE ACETATE 50 MG PO TABS: 50.0000 mg | ORAL_TABLET | Freq: Two times a day (BID) | ORAL | 3 refills | Status: DC

## 2020-02-01 NOTE — Patient Instructions (Signed)

## 2020-02-02 DIAGNOSIS — Z79899 Other long term (current) drug therapy: Secondary | ICD-10-CM | POA: Diagnosis not present

## 2020-02-02 DIAGNOSIS — M961 Postlaminectomy syndrome, not elsewhere classified: Secondary | ICD-10-CM | POA: Diagnosis not present

## 2020-02-02 DIAGNOSIS — M5412 Radiculopathy, cervical region: Secondary | ICD-10-CM | POA: Diagnosis not present

## 2020-02-10 ENCOUNTER — Other Ambulatory Visit: Payer: Self-pay

## 2020-02-10 NOTE — Telephone Encounter (Signed)
Spoke with Raymond Powell (Seymour's wife) confirming he takes Atorvastatin 40 one time a day.  Called in the directions to [facility].

## 2020-03-11 DIAGNOSIS — K219 Gastro-esophageal reflux disease without esophagitis: Secondary | ICD-10-CM | POA: Diagnosis not present

## 2020-03-11 DIAGNOSIS — Z79899 Other long term (current) drug therapy: Secondary | ICD-10-CM | POA: Diagnosis not present

## 2020-03-11 DIAGNOSIS — M545 Low back pain: Secondary | ICD-10-CM | POA: Diagnosis not present

## 2020-03-11 DIAGNOSIS — I10 Essential (primary) hypertension: Secondary | ICD-10-CM | POA: Diagnosis not present

## 2020-03-23 DIAGNOSIS — E119 Type 2 diabetes mellitus without complications: Secondary | ICD-10-CM | POA: Diagnosis not present

## 2020-03-23 DIAGNOSIS — Z9181 History of falling: Secondary | ICD-10-CM | POA: Diagnosis not present

## 2020-03-30 ENCOUNTER — Telehealth: Payer: Self-pay | Admitting: Cardiology

## 2020-03-30 ENCOUNTER — Other Ambulatory Visit: Payer: Self-pay | Admitting: Cardiology

## 2020-03-30 DIAGNOSIS — R002 Palpitations: Secondary | ICD-10-CM

## 2020-03-30 DIAGNOSIS — I480 Paroxysmal atrial fibrillation: Secondary | ICD-10-CM

## 2020-03-30 DIAGNOSIS — M5412 Radiculopathy, cervical region: Secondary | ICD-10-CM

## 2020-03-30 DIAGNOSIS — G5690 Unspecified mononeuropathy of unspecified upper limb: Secondary | ICD-10-CM

## 2020-03-30 MED ORDER — FLECAINIDE ACETATE 50 MG PO TABS: 50.0000 mg | ORAL_TABLET | Freq: Two times a day (BID) | ORAL | 3 refills | Status: DC

## 2020-03-30 NOTE — Telephone Encounter (Signed)
Pt c/o medication issue:  1. Name of Medication: apixaban (ELIQUIS) 5 MG TABS tablet  2. How are you currently taking this medication (dosage and times per day)? As directed   3. Are you having a reaction (difficulty breathing--STAT)? price  4. What is your medication issue? Medication is too expensive. Pt would like to be given an rx for a generic version if possible. Pt was provided with the phone number for the [facility]

## 2020-03-30 NOTE — Addendum Note (Signed)
Addended by: Resa Miner I on: 03/30/2020 04:10 PM   Modules accepted: Orders

## 2020-03-30 NOTE — Telephone Encounter (Signed)
Spoke to the patient just now and let him know that he would need to have some lab work done prior to starting on Xarelto per Dr. Bettina Gavia. I let him know that he could come in anytime for these labs and he states that he will do this.   Order for BMP was placed.

## 2020-03-30 NOTE — Telephone Encounter (Signed)
Spoke to the patient just now. He let me know that he can not afford his Eliquis and he is unable to get assistance from the [employer] patient assistance foundation. I spoke with Dr. Bettina Gavia and he requested that the patient call the number on the back of his insurance card or speak with the pharmacist to see what anticoagulant would be covered by his insurance. The patient verbalizes understanding of this and states that he will call us back to let us know what they tell him.    Encouraged patient to call back with any questions or concerns.

## 2020-03-30 NOTE — Telephone Encounter (Signed)
Refill sent in per request.  

## 2020-03-30 NOTE — Telephone Encounter (Signed)
*  STAT* If patient is at the pharmacy, call can be transferred to refill team.   1. Which medications need to be refilled? (please list name of each medication and dose if known)  flecainide (TAMBOCOR) 50 MG tablet  2. Which pharmacy/location (including street and city if local pharmacy) is medication to be sent to? [place], [place] (Ph: [phone])  3. Do they need a 30 day or 90 day supply? 90 with refills  Pt says the last rx sent to [facility] was for 90 days with no refills. He will run out of Medication around June 20 but wanted to start the process for refills now because of how long mail orders can take

## 2020-03-30 NOTE — Telephone Encounter (Signed)
Follow up   Pt is calling back and says he contacted his pharmacy and his pharmacy says they dont do that because they are a mail order pharmacy    Please call back

## 2020-04-01 DIAGNOSIS — M47816 Spondylosis without myelopathy or radiculopathy, lumbar region: Secondary | ICD-10-CM | POA: Insufficient documentation

## 2020-04-05 DIAGNOSIS — M47896 Other spondylosis, lumbar region: Secondary | ICD-10-CM | POA: Diagnosis not present

## 2020-04-06 ENCOUNTER — Other Ambulatory Visit: Payer: Self-pay

## 2020-04-06 DIAGNOSIS — I480 Paroxysmal atrial fibrillation: Secondary | ICD-10-CM

## 2020-04-06 DIAGNOSIS — R002 Palpitations: Secondary | ICD-10-CM

## 2020-04-06 MED ORDER — APIXABAN 5 MG PO TABS: 5.0000 mg | ORAL_TABLET | Freq: Two times a day (BID) | ORAL | 3 refills | Status: DC

## 2020-04-06 NOTE — Progress Notes (Signed)
Patient called in to request a refill of Eliquis. Sent to [facility].

## 2020-04-20 DIAGNOSIS — E119 Type 2 diabetes mellitus without complications: Secondary | ICD-10-CM | POA: Diagnosis not present

## 2020-04-20 DIAGNOSIS — Z0000 Encounter for general adult medical examination without abnormal findings: Secondary | ICD-10-CM | POA: Diagnosis not present

## 2020-04-20 DIAGNOSIS — K219 Gastro-esophageal reflux disease without esophagitis: Secondary | ICD-10-CM | POA: Diagnosis not present

## 2020-04-20 DIAGNOSIS — Z79899 Other long term (current) drug therapy: Secondary | ICD-10-CM | POA: Diagnosis not present

## 2020-04-20 DIAGNOSIS — E7800 Pure hypercholesterolemia, unspecified: Secondary | ICD-10-CM | POA: Diagnosis not present

## 2020-05-05 ENCOUNTER — Emergency Department (HOSPITAL_COMMUNITY): Payer: Medicare Other

## 2020-05-05 ENCOUNTER — Inpatient Hospital Stay (HOSPITAL_COMMUNITY)
Admission: AC | Admit: 2020-05-05 | Discharge: 2020-05-10 | DRG: 206 | Disposition: A | Discharge status: Home / Self Care | Payer: Medicare Other | Attending: Surgery | Admitting: Surgery

## 2020-05-05 ENCOUNTER — Other Ambulatory Visit: Payer: Self-pay

## 2020-05-05 DIAGNOSIS — S2249XA Multiple fractures of ribs, unspecified side, initial encounter for closed fracture: Secondary | ICD-10-CM | POA: Diagnosis not present

## 2020-05-05 DIAGNOSIS — K219 Gastro-esophageal reflux disease without esophagitis: Secondary | ICD-10-CM | POA: Diagnosis not present

## 2020-05-05 DIAGNOSIS — G43909 Migraine, unspecified, not intractable, without status migrainosus: Secondary | ICD-10-CM | POA: Diagnosis not present

## 2020-05-05 DIAGNOSIS — Z20822 Contact with and (suspected) exposure to covid-19: Secondary | ICD-10-CM | POA: Diagnosis not present

## 2020-05-05 DIAGNOSIS — I4891 Unspecified atrial fibrillation: Secondary | ICD-10-CM | POA: Diagnosis not present

## 2020-05-05 DIAGNOSIS — S27321A Contusion of lung, unilateral, initial encounter: Secondary | ICD-10-CM | POA: Diagnosis not present

## 2020-05-05 DIAGNOSIS — E119 Type 2 diabetes mellitus without complications: Secondary | ICD-10-CM | POA: Diagnosis present

## 2020-05-05 DIAGNOSIS — I10 Essential (primary) hypertension: Secondary | ICD-10-CM | POA: Diagnosis present

## 2020-05-05 DIAGNOSIS — Z03818 Encounter for observation for suspected exposure to other biological agents ruled out: Secondary | ICD-10-CM | POA: Diagnosis not present

## 2020-05-05 DIAGNOSIS — G8929 Other chronic pain: Secondary | ICD-10-CM | POA: Diagnosis present

## 2020-05-05 DIAGNOSIS — I499 Cardiac arrhythmia, unspecified: Secondary | ICD-10-CM | POA: Diagnosis not present

## 2020-05-05 DIAGNOSIS — S79911A Unspecified injury of right hip, initial encounter: Secondary | ICD-10-CM | POA: Diagnosis not present

## 2020-05-05 DIAGNOSIS — S42009A Fracture of unspecified part of unspecified clavicle, initial encounter for closed fracture: Secondary | ICD-10-CM

## 2020-05-05 DIAGNOSIS — S79912A Unspecified injury of left hip, initial encounter: Secondary | ICD-10-CM | POA: Diagnosis not present

## 2020-05-05 DIAGNOSIS — Z87891 Personal history of nicotine dependence: Secondary | ICD-10-CM | POA: Diagnosis not present

## 2020-05-05 DIAGNOSIS — S3991XA Unspecified injury of abdomen, initial encounter: Secondary | ICD-10-CM | POA: Diagnosis not present

## 2020-05-05 DIAGNOSIS — M545 Low back pain: Secondary | ICD-10-CM | POA: Diagnosis present

## 2020-05-05 DIAGNOSIS — M542 Cervicalgia: Secondary | ICD-10-CM | POA: Diagnosis not present

## 2020-05-05 DIAGNOSIS — S42002A Fracture of unspecified part of left clavicle, initial encounter for closed fracture: Secondary | ICD-10-CM | POA: Diagnosis present

## 2020-05-05 DIAGNOSIS — R61 Generalized hyperhidrosis: Secondary | ICD-10-CM | POA: Diagnosis not present

## 2020-05-05 DIAGNOSIS — T1490XA Injury, unspecified, initial encounter: Secondary | ICD-10-CM

## 2020-05-05 DIAGNOSIS — S2242XA Multiple fractures of ribs, left side, initial encounter for closed fracture: Secondary | ICD-10-CM | POA: Diagnosis present

## 2020-05-05 DIAGNOSIS — S27329A Contusion of lung, unspecified, initial encounter: Secondary | ICD-10-CM | POA: Diagnosis not present

## 2020-05-05 DIAGNOSIS — S2232XA Fracture of one rib, left side, initial encounter for closed fracture: Secondary | ICD-10-CM

## 2020-05-05 DIAGNOSIS — R519 Headache, unspecified: Secondary | ICD-10-CM | POA: Diagnosis not present

## 2020-05-05 DIAGNOSIS — S42022A Displaced fracture of shaft of left clavicle, initial encounter for closed fracture: Secondary | ICD-10-CM | POA: Diagnosis not present

## 2020-05-05 DIAGNOSIS — S3993XA Unspecified injury of pelvis, initial encounter: Secondary | ICD-10-CM | POA: Diagnosis not present

## 2020-05-05 DIAGNOSIS — S0001XA Abrasion of scalp, initial encounter: Secondary | ICD-10-CM | POA: Diagnosis not present

## 2020-05-05 DIAGNOSIS — E785 Hyperlipidemia, unspecified: Secondary | ICD-10-CM | POA: Diagnosis present

## 2020-05-05 DIAGNOSIS — M7989 Other specified soft tissue disorders: Secondary | ICD-10-CM | POA: Diagnosis not present

## 2020-05-05 DIAGNOSIS — R52 Pain, unspecified: Secondary | ICD-10-CM | POA: Diagnosis not present

## 2020-05-05 DIAGNOSIS — Z743 Need for continuous supervision: Secondary | ICD-10-CM | POA: Diagnosis not present

## 2020-05-05 DIAGNOSIS — M25552 Pain in left hip: Secondary | ICD-10-CM | POA: Diagnosis not present

## 2020-05-05 DIAGNOSIS — R0689 Other abnormalities of breathing: Secondary | ICD-10-CM | POA: Diagnosis not present

## 2020-05-05 DIAGNOSIS — Z7901 Long term (current) use of anticoagulants: Secondary | ICD-10-CM | POA: Diagnosis not present

## 2020-05-05 DIAGNOSIS — M25559 Pain in unspecified hip: Secondary | ICD-10-CM

## 2020-05-05 DIAGNOSIS — J9811 Atelectasis: Secondary | ICD-10-CM | POA: Diagnosis not present

## 2020-05-05 LAB — SAMPLE TO BLOOD BANK

## 2020-05-05 LAB — CBC
HCT: 46 % (ref 39.0–52.0)
HCT: 41.2 % (ref 39.0–52.0)
Hemoglobin: 14.9 g/dL (ref 13.0–17.0)
Hemoglobin: 13.6 g/dL (ref 13.0–17.0)
MCH: 29.2 pg (ref 26.0–34.0)
MCH: 29 pg (ref 26.0–34.0)
MCHC: 32.4 g/dL (ref 30.0–36.0)
MCHC: 33 g/dL (ref 30.0–36.0)
MCV: 90.2 fL (ref 80.0–100.0)
MCV: 87.8 fL (ref 80.0–100.0)
Platelets: 275 10*3/uL (ref 150–400)
Platelets: 250 10*3/uL (ref 150–400)
RBC: 5.1 MIL/uL (ref 4.22–5.81)
RBC: 4.69 MIL/uL (ref 4.22–5.81)
RDW: 12.8 % (ref 11.5–15.5)
RDW: 13 % (ref 11.5–15.5)
WBC: 12.5 10*3/uL — ABNORMAL HIGH (ref 4.0–10.5)
WBC: 11.2 10*3/uL — ABNORMAL HIGH (ref 4.0–10.5)
nRBC: 0 % (ref 0.0–0.2)
nRBC: 0 % (ref 0.0–0.2)

## 2020-05-05 LAB — I-STAT CHEM 8, ED
BUN: 17 mg/dL (ref 8–23)
Calcium, Ion: 1.09 mmol/L — ABNORMAL LOW (ref 1.15–1.40)
Chloride: 105 mmol/L (ref 98–111)
Creatinine, Ser: 1.2 mg/dL (ref 0.61–1.24)
Glucose, Bld: 146 mg/dL — ABNORMAL HIGH (ref 70–99)
HCT: 42 % (ref 39.0–52.0)
Hemoglobin: 14.3 g/dL (ref 13.0–17.0)
Potassium: 4.8 mmol/L (ref 3.5–5.1)
Sodium: 140 mmol/L (ref 135–145)
TCO2: 26 mmol/L (ref 22–32)

## 2020-05-05 LAB — COMPREHENSIVE METABOLIC PANEL
ALT: 25 U/L (ref 0–44)
AST: 24 U/L (ref 15–41)
Albumin: 3.6 g/dL (ref 3.5–5.0)
Alkaline Phosphatase: 52 U/L (ref 38–126)
Anion gap: 10 (ref 5–15)
BUN: 15 mg/dL (ref 8–23)
CO2: 23 mmol/L (ref 22–32)
Calcium: 8.9 mg/dL (ref 8.9–10.3)
Chloride: 107 mmol/L (ref 98–111)
Creatinine, Ser: 1.1 mg/dL (ref 0.61–1.24)
GFR calc Af Amer: >60 mL/min (ref >60)
GFR calc non Af Amer: >60 mL/min (ref >60)
Glucose, Bld: 148 mg/dL — ABNORMAL HIGH (ref 70–99)
Potassium: 4.9 mmol/L (ref 3.5–5.1)
Sodium: 140 mmol/L (ref 135–145)
Total Bilirubin: 0.4 mg/dL (ref 0.3–1.2)
Total Protein: 6.3 g/dL — ABNORMAL LOW (ref 6.5–8.1)

## 2020-05-05 LAB — SARS CORONAVIRUS 2 BY RT PCR (HOSPITAL ORDER, PERFORMED IN ~~LOC~~ HOSPITAL LAB): SARS Coronavirus 2: NEGATIVE

## 2020-05-05 LAB — LACTIC ACID, PLASMA: Lactic Acid, Venous: 2.2 mmol/L (ref 0.5–1.9)

## 2020-05-05 LAB — HIV ANTIBODY (ROUTINE TESTING W REFLEX): HIV Screen 4th Generation wRfx: NONREACTIVE

## 2020-05-05 LAB — PROTIME-INR
INR: 1.1 (ref 0.8–1.2)
Prothrombin Time: 13.8 seconds (ref 11.4–15.2)

## 2020-05-05 LAB — ETHANOL: Alcohol, Ethyl (B): <10 mg/dL (ref <10)

## 2020-05-05 LAB — MRSA PCR SCREENING: MRSA by PCR: NEGATIVE

## 2020-05-05 MED ORDER — DOCUSATE SODIUM 100 MG PO CAPS
100.0000 mg | ORAL_CAPSULE | Freq: Two times a day (BID) | ORAL | Status: DC
  Administered 2020-05-05 – 2020-05-10 (×11): 100 mg via ORAL
  Filled 2020-05-05 (×11): qty 1

## 2020-05-05 MED ORDER — ONDANSETRON HCL 4 MG/2ML IJ SOLN
4.0000 mg | Freq: Four times a day (QID) | INTRAMUSCULAR | Status: DC | PRN
  Administered 2020-05-05: 4 mg via INTRAVENOUS
  Filled 2020-05-05: qty 2

## 2020-05-05 MED ORDER — OXYCODONE HCL 5 MG PO TABS
10.0000 mg | ORAL_TABLET | ORAL | Status: DC | PRN
  Administered 2020-05-05 – 2020-05-10 (×24): 15 mg via ORAL
  Filled 2020-05-05 (×24): qty 3

## 2020-05-05 MED ORDER — LIDOCAINE 5 % EX PTCH
1.0000 | MEDICATED_PATCH | CUTANEOUS | Status: DC
  Administered 2020-05-05 – 2020-05-09 (×5): 1 via TRANSDERMAL
  Filled 2020-05-05 (×5): qty 1

## 2020-05-05 MED ORDER — PANTOPRAZOLE SODIUM 40 MG PO TBEC
40.0000 mg | DELAYED_RELEASE_TABLET | Freq: Every day | ORAL | Status: DC
  Administered 2020-05-05 – 2020-05-10 (×6): 40 mg via ORAL
  Filled 2020-05-05 (×6): qty 1

## 2020-05-05 MED ORDER — ATENOLOL 25 MG PO TABS
50.0000 mg | ORAL_TABLET | Freq: Every day | ORAL | Status: DC
  Administered 2020-05-05 – 2020-05-09 (×5): 50 mg via ORAL
  Filled 2020-05-05 (×4): qty 2

## 2020-05-05 MED ORDER — HYDROMORPHONE HCL 1 MG/ML IJ SOLN
1.0000 mg | Freq: Once | INTRAMUSCULAR | Status: AC
  Administered 2020-05-05: 1 mg via INTRAVENOUS
  Filled 2020-05-05: qty 1

## 2020-05-05 MED ORDER — HYDRALAZINE HCL 20 MG/ML IJ SOLN: 10.0000 mg | INTRAMUSCULAR | Status: DC | PRN

## 2020-05-05 MED ORDER — IOHEXOL 300 MG/ML  SOLN
100.0000 mL | Freq: Once | INTRAMUSCULAR | Status: AC | PRN
  Administered 2020-05-05: 100 mL via INTRAVENOUS

## 2020-05-05 MED ORDER — POTASSIUM CHLORIDE IN NACL 20-0.45 MEQ/L-% IV SOLN
INTRAVENOUS | Status: DC
  Filled 2020-05-05 (×2): qty 1000

## 2020-05-05 MED ORDER — ACETAMINOPHEN 500 MG PO TABS
1000.0000 mg | ORAL_TABLET | Freq: Four times a day (QID) | ORAL | Status: DC
  Administered 2020-05-05 – 2020-05-10 (×19): 1000 mg via ORAL
  Filled 2020-05-05 (×19): qty 2

## 2020-05-05 MED ORDER — ATORVASTATIN CALCIUM 80 MG PO TABS
80.0000 mg | ORAL_TABLET | Freq: Every day | ORAL | Status: DC
  Administered 2020-05-05 – 2020-05-09 (×5): 80 mg via ORAL
  Filled 2020-05-05 (×4): qty 1

## 2020-05-05 MED ORDER — HYDROMORPHONE HCL 1 MG/ML IJ SOLN
0.5000 mg | INTRAMUSCULAR | Status: DC | PRN
  Administered 2020-05-05: 0.5 mg via INTRAVENOUS
  Filled 2020-05-05: qty 1

## 2020-05-05 MED ORDER — FENTANYL CITRATE (PF) 100 MCG/2ML IJ SOLN
INTRAMUSCULAR | Status: AC | PRN
  Administered 2020-05-05: 200 ug via INTRAVENOUS

## 2020-05-05 MED ORDER — PANTOPRAZOLE SODIUM 40 MG IV SOLR: 40.0000 mg | Freq: Every day | INTRAVENOUS | Status: DC

## 2020-05-05 MED ORDER — BACITRACIN-NEOMYCIN-POLYMYXIN OINTMENT TUBE
1.0000 "application " | TOPICAL_OINTMENT | Freq: Two times a day (BID) | CUTANEOUS | Status: DC
  Administered 2020-05-05 – 2020-05-10 (×10): 1 via TOPICAL
  Filled 2020-05-05: qty 14

## 2020-05-05 MED ORDER — FLECAINIDE ACETATE 50 MG PO TABS
50.0000 mg | ORAL_TABLET | Freq: Two times a day (BID) | ORAL | Status: DC
  Administered 2020-05-05 – 2020-05-10 (×10): 50 mg via ORAL
  Filled 2020-05-05 (×11): qty 1

## 2020-05-05 MED ORDER — METHOCARBAMOL 500 MG PO TABS
1000.0000 mg | ORAL_TABLET | Freq: Three times a day (TID) | ORAL | Status: DC
  Administered 2020-05-05 – 2020-05-10 (×15): 1000 mg via ORAL
  Filled 2020-05-05 (×15): qty 2

## 2020-05-05 MED ORDER — BISACODYL 10 MG RE SUPP
10.0000 mg | Freq: Every day | RECTAL | Status: DC | PRN
  Administered 2020-05-06: 10 mg via RECTAL
  Filled 2020-05-05: qty 1

## 2020-05-05 MED ORDER — APIXABAN 5 MG PO TABS
5.0000 mg | ORAL_TABLET | Freq: Two times a day (BID) | ORAL | Status: DC
  Administered 2020-05-05 – 2020-05-10 (×10): 5 mg via ORAL
  Filled 2020-05-05 (×9): qty 1

## 2020-05-05 MED ORDER — IBUPROFEN 200 MG PO TABS
600.0000 mg | ORAL_TABLET | Freq: Three times a day (TID) | ORAL | Status: DC
  Administered 2020-05-05 – 2020-05-06 (×3): 600 mg via ORAL
  Filled 2020-05-05: qty 1
  Filled 2020-05-05 (×4): qty 3

## 2020-05-05 MED ORDER — ONDANSETRON 4 MG PO TBDP
4.0000 mg | ORAL_TABLET | Freq: Four times a day (QID) | ORAL | Status: DC | PRN
  Administered 2020-05-06 – 2020-05-09 (×2): 4 mg via ORAL
  Filled 2020-05-05 (×2): qty 1

## 2020-05-05 NOTE — Progress Notes (Signed)
Responded to ED page to support pt. Pt came to ED as level 2.  Patieng is resting per nurse.  Will follow as needed.   Jaclynn Major, [place], [facility], Pager [phone] .
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
## 2020-05-05 NOTE — ED Notes (Signed)
Gave the patient a urinal patient is resting with call bell in reach and family at bedside
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
## 2020-05-05 NOTE — Progress Notes (Signed)
Orthopedic Tech Progress Note Patient Details:  Raymond Powell 02-15-1956 [id number]  Ortho Devices Type of Ortho Device: Sling immobilizer Ortho Device/Splint Location: ULE Ortho Device/Splint Interventions: Application, Ordered   Post Interventions Patient Tolerated: Well   [name] A [name] 05/05/2020, 4:52 PM
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
## 2020-05-05 NOTE — ED Notes (Signed)
Helped get patient on the monitor patient is resting with nurses at bedside
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
## 2020-05-05 NOTE — Progress Notes (Signed)
Patient stated that he is not feeling safe without his Atenolol, for his Afib. MD notified, and atenolol prescribed

## 2020-05-06 ENCOUNTER — Inpatient Hospital Stay (HOSPITAL_COMMUNITY): Payer: Medicare Other

## 2020-05-06 LAB — COMPREHENSIVE METABOLIC PANEL
ALT: 24 U/L (ref 0–44)
AST: 21 U/L (ref 15–41)
Albumin: 3.2 g/dL — ABNORMAL LOW (ref 3.5–5.0)
Alkaline Phosphatase: 45 U/L (ref 38–126)
Anion gap: 9 (ref 5–15)
BUN: 17 mg/dL (ref 8–23)
CO2: 23 mmol/L (ref 22–32)
Calcium: 8.6 mg/dL — ABNORMAL LOW (ref 8.9–10.3)
Chloride: 104 mmol/L (ref 98–111)
Creatinine, Ser: 0.9 mg/dL (ref 0.61–1.24)
GFR calc Af Amer: >60 mL/min (ref >60)
GFR calc non Af Amer: >60 mL/min (ref >60)
Glucose, Bld: 128 mg/dL — ABNORMAL HIGH (ref 70–99)
Potassium: 4.3 mmol/L (ref 3.5–5.1)
Sodium: 136 mmol/L (ref 135–145)
Total Bilirubin: 0.7 mg/dL (ref 0.3–1.2)
Total Protein: 5.6 g/dL — ABNORMAL LOW (ref 6.5–8.1)

## 2020-05-06 LAB — CBC
HCT: 40.3 % (ref 39.0–52.0)
Hemoglobin: 13.1 g/dL (ref 13.0–17.0)
MCH: 29.2 pg (ref 26.0–34.0)
MCHC: 32.5 g/dL (ref 30.0–36.0)
MCV: 90 fL (ref 80.0–100.0)
Platelets: 237 10*3/uL (ref 150–400)
RBC: 4.48 MIL/uL (ref 4.22–5.81)
RDW: 13 % (ref 11.5–15.5)
WBC: 10.4 10*3/uL (ref 4.0–10.5)
nRBC: 0 % (ref 0.0–0.2)

## 2020-05-06 MED ORDER — HYDROMORPHONE HCL 1 MG/ML IJ SOLN
0.2500 mg | Freq: Three times a day (TID) | INTRAMUSCULAR | Status: DC | PRN
  Administered 2020-05-06 – 2020-05-08 (×2): 0.25 mg via INTRAVENOUS
  Filled 2020-05-06 (×2): qty 1
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
## 2020-05-06 NOTE — Progress Notes (Signed)
SATURATION QUALIFICATIONS: (This note is used to comply with regulatory documentation for home oxygen)  Patient Saturations on Room Air at Rest = 91%  Patient Saturations on Room Air while Ambulating = 83%  Patient Saturations on 2 Liters of oxygen while Ambulating = 92%  Please briefly explain why patient needs home oxygen: pt reports shortness of breath with mobility while on room air and even on 1L [place] while mobilizing. Pt desaturates into the mid to low 80s without supplemental oxygen and requires 2L [place] to maintain oxygen saturations at or above 92% while mobilizing.  Zenaida Niece, PT, DPT Acute Rehabilitation Pager: [phone]
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
## 2020-05-06 NOTE — Care Management (Signed)
Pt requires bariatric bedside commode due to body habitus.   Reinaldo Raddle, RN, BSN  Trauma/Neuro ICU Case Manager [phone]
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
## 2020-05-07 ENCOUNTER — Inpatient Hospital Stay (HOSPITAL_COMMUNITY): Payer: Medicare Other
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
## 2020-05-08 ENCOUNTER — Encounter (HOSPITAL_COMMUNITY): Payer: Self-pay

## 2020-05-08 LAB — BASIC METABOLIC PANEL
Anion gap: 10 (ref 5–15)
BUN: 11 mg/dL (ref 8–23)
CO2: 25 mmol/L (ref 22–32)
Calcium: 8.7 mg/dL — ABNORMAL LOW (ref 8.9–10.3)
Chloride: 103 mmol/L (ref 98–111)
Creatinine, Ser: 0.82 mg/dL (ref 0.61–1.24)
GFR calc Af Amer: >60 mL/min (ref >60)
GFR calc non Af Amer: >60 mL/min (ref >60)
Glucose, Bld: 215 mg/dL — ABNORMAL HIGH (ref 70–99)
Potassium: 3.7 mmol/L (ref 3.5–5.1)
Sodium: 138 mmol/L (ref 135–145)

## 2020-05-08 LAB — MAGNESIUM: Magnesium: 1.7 mg/dL (ref 1.7–2.4)

## 2020-05-08 LAB — TSH: TSH: 4.238 u[IU]/mL (ref 0.350–4.500)

## 2020-05-08 MED ORDER — METOPROLOL TARTRATE 5 MG/5ML IV SOLN
2.5000 mg | Freq: Once | INTRAVENOUS | Status: AC
  Administered 2020-05-08: 2.5 mg via INTRAVENOUS

## 2020-05-08 MED ORDER — METOPROLOL TARTRATE 5 MG/5ML IV SOLN
5.0000 mg | Freq: Four times a day (QID) | INTRAVENOUS | Status: DC | PRN
  Administered 2020-05-08: 5 mg via INTRAVENOUS
  Filled 2020-05-08 (×2): qty 5

## 2020-05-08 MED ORDER — DILTIAZEM HCL-DEXTROSE 125-5 MG/125ML-% IV SOLN (PREMIX)
5.0000 mg/h | INTRAVENOUS | Status: DC
  Administered 2020-05-08: 5 mg/h via INTRAVENOUS
  Filled 2020-05-08 (×2): qty 125

## 2020-05-08 MED ORDER — DILTIAZEM LOAD VIA INFUSION
20.0000 mg | Freq: Once | INTRAVENOUS | Status: AC
  Administered 2020-05-08: 20 mg via INTRAVENOUS
  Filled 2020-05-08: qty 20
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
## 2020-05-09 LAB — GLUCOSE, CAPILLARY: Glucose-Capillary: 143 mg/dL — ABNORMAL HIGH (ref 70–99)

## 2020-05-09 MED ORDER — BISACODYL 10 MG RE SUPP
10.0000 mg | Freq: Once | RECTAL | Status: DC
  Filled 2020-05-09: qty 1

## 2020-05-09 MED ORDER — MAGNESIUM CITRATE PO SOLN
1.0000 | Freq: Once | ORAL | Status: AC
  Administered 2020-05-09: 1 via ORAL
  Filled 2020-05-09: qty 296

## 2020-05-09 NOTE — Significant Event (Addendum)
Rapid Response Event Note  Overview: Called d/t frequent pauses in HR(up to 6sec.)   Pt started on cardizem gtt at 2332 after 20mg  load for Afib with RVR(HR-130s).   Cardizem gtt turned off at 0044.   Initial Focused Assessment: Pt laying in bed with eyes opened, alert and oriented. Pt says he could feel his heart beating fast earlier. At this time, pt denies chest pain/SOB/lighteheadedness/dizziness, but does say his back is hurting. Lung diminished t/o. Skin warm and dry. Pt having frequent pauses in heart rate (up to 6 secs). HR-80(Afib), BP-103/65, RR-16, SpO2-95% on RA.  Interventions: Cardizem gtt off(done PTA RRT) Atropine at bedside Plan of Care (if not transferred): Pt is currently asymptomatic. Leave Cardizem off at this time. Continue to monitor VS closely. Atropine at bedside in case needed. Call RRT if further assistance needed.  Event Summary:  Kennon Holter, NP notified by bedside RN  Called: [phone] Arrived: 0100  Dillard [place]
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
## 2020-05-09 NOTE — Progress Notes (Signed)
PT request this nurse come to room to answer some questions. Wife at bedside pt sitting in chair. Pt asking if DC is happening this shift. This nurse reported that it depended on how PT/OT went. Pt then reported not feeling well and thinking it was related to blood sugar levels being low. This nurse checked CBG and it was 143. Pt stated this was not bad considering the meal was 2 hours ago. Pt consumed mag citrate all at one time. This nurse left pt sitting in chair. OT contacted this nurse stating pt was helped to bedside commode and pt successfully had BM.  OT reported pt was very shaky and did not "look good". This nurse reported to OT this is how he looked earlier. Ot reported pt was put in the bed at that time. Will continue to monitor. Katherina Right RN
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
## 2020-05-09 NOTE — TOC CAGE-AID Note (Signed)
Transition of Care [facility]) - CAGE-AID Screening   Patient Details  Name: Raymond Powell MRN: [id number] Date of Birth: August 05, 1956  Transition of Care [facility]) CM/SW Contact:    Emeterio Reeve, [place] Phone Number: 05/09/2020, 3:02 PM   Clinical Narrative:  Pt denied alcohol and substance use.   CAGE-AID Screening:    Have You Ever Felt You Ought to Cut Down on Your Drinking or Drug Use?: No Have People Annoyed You By Critizing Your Drinking Or Drug Use?: No Have You Felt Bad Or Guilty About Your Drinking Or Drug Use?: No Have You Ever Had a Drink or Used Drugs First Thing In The Morning to STeady Your Nerves or to Get Rid of a Hangover?: No CAGE-AID Score: 0  Substance Abuse Education Offered: Yes  Substance abuse interventions: Patient Counseling  Emeterio Reeve, Latanya Presser, [place] Social Worker [phone]

## 2020-05-09 NOTE — Progress Notes (Signed)
Pt at start of shift was c/o chest palpitations. Pt HR was fluctuating between 130s and 160s. EKG was performed and placed in the chart, MD was notified. New orders received for an additional 2.5mg  of metoprolol. Medication given per MAR and HR continued to stay elevated in the 130s-140s. New orders received to start Cardizem drip, medication given per [facility]. HR decreased, pauses noted greater than 5 secs. Cardizem dripped stopped per MAR. MD and Rapid Response was notified. No new orders placed by MD.    Sharin Mons, RN
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
## 2020-05-10 MED ORDER — METHOCARBAMOL 500 MG PO TABS: 1000.0000 mg | ORAL_TABLET | Freq: Three times a day (TID) | ORAL | 0 refills | Status: DC

## 2020-05-10 MED ORDER — OXYCODONE HCL 5 MG PO TABS: 5.0000 mg | ORAL_TABLET | ORAL | 0 refills | Status: DC | PRN

## 2020-05-10 MED ORDER — ACETAMINOPHEN 500 MG PO TABS: 500.0000 mg | ORAL_TABLET | Freq: Four times a day (QID) | ORAL | 0 refills | Status: DC | PRN

## 2020-05-10 NOTE — Care Management Important Message (Signed)
Important Message  Patient Details  Name: Raymond Powell MRN: [id number] Date of Birth: 04/17/1956   Medicare Important Message Given:  Yes Patient left prior to IM delivery.  IM mailed to the patient home.    Champion Corales 05/10/2020, 2:35 PM
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
## 2020-05-10 NOTE — Discharge Instructions (Signed)
Rib Fracture  A rib fracture is a break or crack in one of the bones of the ribs. The ribs are like a cage that goes around your upper chest. A broken or cracked rib is often painful, but most do not cause other problems. Most rib fractures usually heal on their own in 1-3 months. Follow these instructions at home: Managing pain, stiffness, and swelling  If directed, apply ice to the injured area. ? Put ice in a plastic bag. ? Place a towel between your skin and the bag. ? Leave the ice on for 20 minutes, 2-3 times a day.  Take over-the-counter and prescription medicines only as told by your doctor. Activity  Avoid activities that cause pain to the injured area. Protect your injured area.  Slowly increase activity as told by your doctor. General instructions  Do deep breathing as told by your doctor. You may be told to: ? Take deep breaths many times a day. ? Cough many times a day while hugging a pillow. ? Use a device (incentive spirometer) to do deep breathing many times a day.  Drink enough fluid to keep your pee (urine) clear or pale yellow.  Do not wear a rib belt or binder. These do not allow you to breathe deeply.  Keep all follow-up visits as told by your doctor. This is important. Contact a doctor if:  You have a fever. Get help right away if:  You have trouble breathing.  You are short of breath.  You cannot stop coughing.  You cough up thick or bloody spit (sputum).  You feel sick to your stomach (nauseous), throw up (vomit), or have belly (abdominal) pain.  Your pain gets worse and medicine does not help. Summary  A rib fracture is a break or crack in one of the bones of the ribs.  Apply ice to the injured area and take medicines for pain as told by your doctor.  Take deep breaths and cough many times a day. Hug a pillow every time you cough. This information is not intended to replace advice given to you by your health care provider. Make sure you  discuss any questions you have with your health care provider. Document Revised: 10/11/2017 Document Reviewed: 01/29/2017 Elsevier Patient Education  2020 [employer].   Clavicle Fracture  A clavicle fracture is a broken collarbone. The collarbone is the long bone that connects your shoulder to your chest wall. A broken collarbone may be treated with a sling or with surgery. Treatment depends on whether the broken ends of the bone are out of place or not. Follow these instructions at home: If you have a sling:  Wear the sling as told by your doctor. Take it off only as told by your doctor.  Loosen the sling if your fingers tingle, become numb, or turn cold and blue.  Do not lift your arm. Keep it across your chest.  Keep the sling clean.  Ask your doctor if you may take off the sling for bathing. ? If your sling is not waterproof, do not let it get wet. Cover the sling with a watertight covering if you take a bath or a shower while wearing it. ? If you may take off your sling when you take a bath or a shower, keep your shoulder in the same position as when the sling is on. Managing pain, stiffness, and swelling   If told, put ice on the injured area: ? If you have a removable sling,  take it off as told by your doctor. ? Put ice in a plastic bag. ? Place a towel between your skin and the bag. ? Leave the ice on for 20 minutes, 2-3 times a day. Activity  Avoid activities that make your symptoms worse for 4-6 weeks, or as long as told.  Ask your doctor when it is safe for you to drive.  Do exercises as told by your doctor. General instructions  Do not use any products that contain nicotine or tobacco, such as cigarettes and e-cigarettes. These can delay bone healing. If you need help quitting, ask your doctor.  Take over-the-counter and prescription medicines only as told by your doctor.  Keep all follow-up visits as told by your doctor. This is important. Contact a doctor  if:  Your medicine is not making you feel less pain.  Your medicine is not making swelling better. Get help right away if:  Your cannot feel your arm (your arm is numb).  Your arm is cold.  Your arm is a lighter color than normal. Summary  A clavicle fracture is a broken collarbone. The collarbone is the long bone that connects your shoulder to your chest wall.  Treatment depends on whether the broken ends of the bone are out of place or not.  If you have a sling, wear it as told by your doctor.  Do exercises when your doctor says you can. The exercises will help your arm get strong and move like it used to. This information is not intended to replace advice given to you by your health care provider. Make sure you discuss any questions you have with your health care provider. Document Revised: 10/11/2017 Document Reviewed: 09/17/2016 Elsevier Patient Education  [place].   How To Use a Sling A sling is a type of hanging bandage. You wear it around your neck to protect an injured arm, shoulder, or other body part. You may need to wear a sling so that your injured body part does not move (is immobilized) while it heals. Keeping the injured part of your body still can lessen pain and speed up healing. Your doctor may suggest that you use a sling if you have:  A broken arm.  A broken collarbone.  A shoulder injury.  Surgery. What are the risks? Wearing a sling is safe. In some cases, wearing a sling the wrong way can:  Make your injury worse.  Cause stiffness or loss of feeling (numbness).  Affect blood flow (circulation) in your arm and hand. This can cause tingling or loss of feeling in your fingers or hands. How to use a sling Follow instructions from your doctor about how and when to wear your sling. Your doctor will show you or tell you:  How to put on the sling.  How to adjust the sling.  When and how often to wear the sling.  How to take off the  sling. The way that you use a sling depends on your injury. Follow these instructions (unless your doctor tells you other instructions):  Wear the sling so that your elbow bends to the shape of a capital letter "L" (at a 90-degree angle, also called a right angle).  Make sure the sling supports your wrist and your hand.  Adjust the sling if your fingers or hand start to tingle or lose feeling. Follow these instructions at home:  Try to not move your arm.  Do not twist, lift, or move your arm in a  way that could make your injury worse.  Do not lean on your arm while you have to wear a sling.  Do not lift anything while you have to wear a sling. Contact a doctor if:  You have: ? Bruising, swelling, or pain that gets worse. ? Pain that does not get better with medicine. ? A fever.  Your sling: ? Does not support your arm like it should. ? Gets damaged. Get help right away if:  You lose feeling in your fingers.  Your fingers: ? Are tingling. ? Turn blue. ? Feel cold to the touch.  You cannot control the bleeding from your injury.  You have shortness of breath. Summary  A sling is a type of hanging bandage. You wear it around your neck to protect an injured arm, shoulder, or other body part.  You may need to wear a sling so that your injured body part does not move (is immobilized) while it heals.  The way that you use a sling depends on your injury. Follow instructions from your doctor about how and when to wear your sling.  In general, you should wear the sling so that your elbow bends to the shape of a capital letter "L." This information is not intended to replace advice given to you by your health care provider. Make sure you discuss any questions you have with your health care provider. Document Revised: 10/11/2017 Document Reviewed: 09/19/2017 Elsevier Patient Education  2020 [employer].

## 2020-05-10 NOTE — Progress Notes (Signed)
DC instructions given to pt and wife. Periferal iv removed from right ac. Pt tolerated well. Pt taken to car via wc. Katherina Right RN

## 2020-05-11 ENCOUNTER — Encounter: Payer: Self-pay | Admitting: Cardiology

## 2020-05-30 DIAGNOSIS — R0781 Pleurodynia: Secondary | ICD-10-CM | POA: Diagnosis not present

## 2020-05-30 DIAGNOSIS — M25512 Pain in left shoulder: Secondary | ICD-10-CM | POA: Diagnosis not present

## 2020-06-06 DIAGNOSIS — R0781 Pleurodynia: Secondary | ICD-10-CM | POA: Diagnosis not present

## 2020-06-10 ENCOUNTER — Other Ambulatory Visit: Payer: Self-pay | Admitting: Cardiology

## 2020-06-14 ENCOUNTER — Other Ambulatory Visit: Payer: Self-pay | Admitting: Cardiology

## 2020-06-17 DIAGNOSIS — I4891 Unspecified atrial fibrillation: Secondary | ICD-10-CM | POA: Insufficient documentation

## 2020-06-17 DIAGNOSIS — Z5181 Encounter for therapeutic drug level monitoring: Secondary | ICD-10-CM | POA: Diagnosis not present

## 2020-06-17 DIAGNOSIS — Z79899 Other long term (current) drug therapy: Secondary | ICD-10-CM | POA: Diagnosis not present

## 2020-06-17 DIAGNOSIS — M47896 Other spondylosis, lumbar region: Secondary | ICD-10-CM | POA: Diagnosis not present

## 2020-06-17 DIAGNOSIS — D6869 Other thrombophilia: Secondary | ICD-10-CM | POA: Insufficient documentation

## 2020-06-24 ENCOUNTER — Other Ambulatory Visit: Payer: Self-pay | Admitting: Cardiology

## 2020-06-24 MED ORDER — FLECAINIDE ACETATE 100 MG PO TABS
ORAL_TABLET | ORAL | 3 refills | Status: DC
Start: 2020-06-24 — End: 2020-06-27

## 2020-06-24 NOTE — Telephone Encounter (Signed)
Spoke to patient just now and let him know Dr. Joya Gaskins recommendations. He verbalizes understanding and thanks me for the call back.   Encouraged patient to call back with any questions or concerns.

## 2020-06-24 NOTE — Telephone Encounter (Signed)
Yes lets have him alternate 100 with 50 mg and had like to see him in the office in about a week and will do a level at that time

## 2020-06-24 NOTE — Addendum Note (Signed)
Addended by: Resa Miner I on: 06/24/2020 03:12 PM   Modules accepted: Orders

## 2020-06-24 NOTE — Telephone Encounter (Signed)
New message:    Patient calling concerning some medication Flecainide. Please call patient.

## 2020-06-24 NOTE — Telephone Encounter (Signed)
Spoke to patient just now and he let me know that he has been feeling like he is going in and out of Atrial Fibrillation. He states that when he first began taking his flecainide it seemed to control it but he is now having episodes where he feels like he is going back into atrial fibrillation. He is wondering if his dosage needs to be increased or what he needs to do.   I will route to Dr. Bettina Gavia for further recommendations.

## 2020-06-27 DIAGNOSIS — M25512 Pain in left shoulder: Secondary | ICD-10-CM | POA: Diagnosis not present

## 2020-06-27 DIAGNOSIS — S42002A Fracture of unspecified part of left clavicle, initial encounter for closed fracture: Secondary | ICD-10-CM | POA: Diagnosis not present

## 2020-06-27 MED ORDER — FLECAINIDE ACETATE 100 MG PO TABS
50.0000 mg | ORAL_TABLET | Freq: Two times a day (BID) | ORAL | 3 refills | Status: DC
Start: 2020-06-27 — End: 2020-07-08

## 2020-06-27 NOTE — Addendum Note (Signed)
Addended by: Resa Miner I on: 06/27/2020 10:10 AM   Modules accepted: Orders

## 2020-06-27 NOTE — Telephone Encounter (Signed)
Spoke with patient just now and let him know Dr. Joya Gaskins recommendations. He verbalizes understanding and thanks me for the call back.

## 2020-06-27 NOTE — Telephone Encounter (Signed)
Resume flecanide 50 mg bid

## 2020-06-27 NOTE — Telephone Encounter (Signed)
    STAT if HR is under 50 or over 120 (normal HR is 60-100 beats per minute)  1) What is your heart rate? 74  2) Do you have a log of your heart rate readings (document readings)? 39,74  3) Do you have any other symptoms? Pt said he took new prescription DR. Munley gave last Friday but today he woke up with cold sweat with a HR of 39. He said he didn't feel good, he had breakfast and went for a little walk and current HR is 74. He said he just took his blood thinner not yet his afib meds,
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
## 2020-07-08 ENCOUNTER — Other Ambulatory Visit: Payer: Self-pay

## 2020-07-08 ENCOUNTER — Ambulatory Visit (INDEPENDENT_AMBULATORY_CARE_PROVIDER_SITE_OTHER): Payer: Medicare Other

## 2020-07-08 ENCOUNTER — Ambulatory Visit: Payer: Medicare Other | Admitting: Cardiology

## 2020-07-08 VITALS — BP 145/88 | HR 80 | Ht 73.0 in | Wt 278.0 lb

## 2020-07-08 DIAGNOSIS — Z7901 Long term (current) use of anticoagulants: Secondary | ICD-10-CM | POA: Diagnosis not present

## 2020-07-08 DIAGNOSIS — I480 Paroxysmal atrial fibrillation: Secondary | ICD-10-CM

## 2020-07-08 DIAGNOSIS — I484 Atypical atrial flutter: Secondary | ICD-10-CM

## 2020-07-08 DIAGNOSIS — Z79899 Other long term (current) drug therapy: Secondary | ICD-10-CM | POA: Diagnosis not present

## 2020-07-08 MED ORDER — FLECAINIDE ACETATE 50 MG PO TABS: ORAL_TABLET | ORAL | 3 refills | Status: DC

## 2020-07-08 MED ORDER — ATENOLOL 50 MG PO TABS: 25.0000 mg | ORAL_TABLET | Freq: Every day | ORAL | 2 refills | Status: DC

## 2020-07-08 NOTE — Patient Instructions (Signed)
Medication Instructions:  Your physician has recommended you make the following change in your medication:  DECREASE: Atenolol 25 mg per 0.5 tablet daily.  INCREASE: Flecainide 25 mg in the morning and 50 mg in the evening.  *If you need a refill on your cardiac medications before your next appointment, please call your pharmacy*   Lab Work: Your physician recommends that you return for lab work in: Flecainide level If you have labs (blood work) drawn today and your tests are completely normal, you will receive your results only by: Marland Kitchen MyChart Message (if you have MyChart) OR . A paper copy in the mail If you have any lab test that is abnormal or we need to change your treatment, we will call you to review the results.   Testing/Procedures: A zio monitor was ordered today. It will remain on for 7 days. You will then return monitor and event diary in provided box. It takes 1-2 weeks for report to be downloaded and returned to [place]. We will call you with the results. If monitor falls off or has orange flashing light, please call [employer] for further instructions.      Follow-Up: At [facility], you and your health needs are our priority.  As part of our continuing mission to provide you with exceptional heart care, we have created designated Provider Care Teams.  These Care Teams include your primary Cardiologist (physician) and Advanced Practice Providers (APPs -  Physician Assistants and Nurse Practitioners) who all work together to provide you with the care you need, when you need it.  We recommend signing up for the patient portal called "MyChart".  Sign up information is provided on this After Visit Summary.  MyChart is used to connect with patients for Virtual Visits (Telemedicine).  Patients are able to view lab/test results, encounter notes, upcoming appointments, etc.  Non-urgent messages can be sent to your provider as well.   To learn more about what you can do with MyChart, go to  [email or web address].    Your next appointment:   1 month(s)  The format for your next appointment:   In Person  Provider:   Shirlee More, MD   Other Instructions
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
## 2020-07-11 DIAGNOSIS — M25512 Pain in left shoulder: Secondary | ICD-10-CM | POA: Diagnosis not present

## 2020-07-12 LAB — FLECAINIDE LEVEL: Flecainide: <0.1 ug/mL — ABNORMAL LOW (ref 0.20–1.00)

## 2020-08-01 NOTE — Addendum Note (Signed)
Addended by: Truddie Hidden on: 08/01/2020 08:36 AM   Modules accepted: Orders

## 2020-08-10 DIAGNOSIS — E119 Type 2 diabetes mellitus without complications: Secondary | ICD-10-CM | POA: Insufficient documentation

## 2020-08-10 DIAGNOSIS — I10 Essential (primary) hypertension: Secondary | ICD-10-CM | POA: Insufficient documentation

## 2020-08-10 DIAGNOSIS — E669 Obesity, unspecified: Secondary | ICD-10-CM | POA: Insufficient documentation

## 2020-08-10 DIAGNOSIS — J189 Pneumonia, unspecified organism: Secondary | ICD-10-CM | POA: Insufficient documentation

## 2020-08-10 DIAGNOSIS — K219 Gastro-esophageal reflux disease without esophagitis: Secondary | ICD-10-CM | POA: Insufficient documentation

## 2020-08-10 DIAGNOSIS — E785 Hyperlipidemia, unspecified: Secondary | ICD-10-CM | POA: Insufficient documentation

## 2020-08-10 DIAGNOSIS — G43909 Migraine, unspecified, not intractable, without status migrainosus: Secondary | ICD-10-CM | POA: Insufficient documentation

## 2020-08-10 DIAGNOSIS — M549 Dorsalgia, unspecified: Secondary | ICD-10-CM | POA: Insufficient documentation

## 2020-08-11 ENCOUNTER — Ambulatory Visit (INDEPENDENT_AMBULATORY_CARE_PROVIDER_SITE_OTHER): Payer: Medicare Other | Admitting: Cardiology

## 2020-08-11 ENCOUNTER — Encounter: Payer: Self-pay | Admitting: Cardiology

## 2020-08-11 ENCOUNTER — Other Ambulatory Visit: Payer: Self-pay

## 2020-08-11 VITALS — BP 146/88 | HR 83 | Ht 73.0 in | Wt 284.0 lb

## 2020-08-11 DIAGNOSIS — I484 Atypical atrial flutter: Secondary | ICD-10-CM

## 2020-08-11 DIAGNOSIS — I10 Essential (primary) hypertension: Secondary | ICD-10-CM

## 2020-08-11 DIAGNOSIS — Z79899 Other long term (current) drug therapy: Secondary | ICD-10-CM

## 2020-08-11 DIAGNOSIS — I480 Paroxysmal atrial fibrillation: Secondary | ICD-10-CM

## 2020-08-11 DIAGNOSIS — Z7901 Long term (current) use of anticoagulants: Secondary | ICD-10-CM

## 2020-08-11 NOTE — Progress Notes (Signed)
Cardiology Office Note:    Date:  08/11/2020   ID:  Raymond Powell, DOB 05-04-1956, MRN [id number]  PCP:  Serita Grammes, MD  Cardiologist:  Shirlee More, MD    Referring MD: Serita Grammes, MD    ASSESSMENT:    1. Hypertension, unspecified type   2. Atypical atrial flutter (HCC)   3. PAF (paroxysmal atrial fibrillation) ([place])   4. High risk medication use   5. Chronic anticoagulation    PLAN:    In order of problems listed above:  1. Is improved.  I think he is best served by EP catheter ablation as he has had bradycardia but this is present treatment and has had atrial flutter with rapid ventricular response in hospital he is scheduled to be seen by EP continue his current medications which are tolerated and effective and I will see him in follow-up after EP care is completed.  He will continue flecainide beta-blocker and anticoagulant   Next appointment: As needed   Medication Adjustments/Labs and Tests Ordered: Current medicines are reviewed at length with the patient today.  Concerns regarding medicines are outlined above.  Orders Placed This Encounter  Procedures  . EKG 12-Lead   No orders of the defined types were placed in this encounter.   Follow-up for SVT  History of Present Illness:    Raymond Powell is a 64 y.o. male with a hx of paroxysmal atrial fibrillation on flecainide and anticoagulated and a recent pulmonary contusion last seen 07/08/2020.  He has had recurrent PAF and symptomatic bradyvcardia in both his beta-blocker and flecainide have been decreased in the dosage Compliance with diet, lifestyle and medications: Yes  He has had a significant improvement but still has chest wall pain 3 months after his trauma.  He has had no recurrent breakthrough episodes of rapid heart rhythm or bradycardia and wants to follow through with EP Past Medical History:  Diagnosis Date  . Back pain   . Cervical radiculopathy 11/21/2017  . GERD without  esophagitis   . Hyperlipidemia   . Hypertension   . Low back pain 04/16/2012  . Lumbar post-laminectomy syndrome 11/18/2017  . Migraine   . Neck pain 10/01/2012  . Neuropathy of upper extremity 10/01/2012  . Obesity   . On long term drug therapy 11/18/2017  . Pain of right shoulder joint on movement 11/21/2017  . Piriformis syndrome 06/17/2012  . Pneumonia   . Sacroiliitis, not elsewhere classified ([place]) 04/16/2012  . Shoulder pain 10/01/2012  . Type 2 diabetes mellitus ([place])     Past Surgical History:  Procedure Laterality Date  . Arthroscopic knee surgery    . LAMINECTOMY      Current Medications: Current Meds  Medication Sig  . apixaban (ELIQUIS) 5 MG TABS tablet Take 1 tablet (5 mg total) by mouth 2 (two) times daily.  Marland Kitchen atenolol (TENORMIN) 50 MG tablet Take 0.5 tablets (25 mg total) by mouth daily. (Patient taking differently: 50 mg at bedtime. )  . atorvastatin (LIPITOR) 80 MG tablet Take 80 mg by mouth daily.  . flecainide (TAMBOCOR) 50 MG tablet Take 0.5 tablet (25 mg) total in the morning and 1 tablet (50 mg ) total in the evening.  . lansoprazole (PREVACID) 30 MG capsule Take 30 mg by mouth daily.  . meloxicam (MOBIC) 15 MG tablet meloxicam 15 mg tablet  . oxyCODONE-acetaminophen (PERCOCET) 10-325 MG tablet Take 1 tablet by mouth 5 (five) times daily as needed.     Allergies:  Codeine   Social History   Socioeconomic History  . Marital status: Married    Spouse name: Not on file  . Number of children: Not on file  . Years of education: Not on file  . Highest education level: Not on file  Occupational History  . Not on file  Tobacco Use  . Smoking status: Former Smoker    Quit date: 12/11/1988    Years since quitting: 31.6  . Smokeless tobacco: Never Used  Vaping Use  . Vaping Use: Never used  Substance and Sexual Activity  . Alcohol use: Never  . Drug use: Never  . Sexual activity: Yes  Other Topics Concern  . Not on file  Social History Narrative   **  Merged History Encounter **       Social Determinants of Health   Financial Resource Strain:   . Difficulty of Paying Living Expenses: Not on file  Food Insecurity:   . Worried About Charity fundraiser in the Last Year: Not on file  . Ran Out of Food in the Last Year: Not on file  Transportation Needs:   . Lack of Transportation (Medical): Not on file  . Lack of Transportation (Non-Medical): Not on file  Physical Activity:   . Days of Exercise per Week: Not on file  . Minutes of Exercise per Session: Not on file  Stress:   . Feeling of Stress : Not on file  Social Connections:   . Frequency of Communication with Friends and Family: Not on file  . Frequency of Social Gatherings with Friends and Family: Not on file  . Attends Religious Services: Not on file  . Active Member of Clubs or Organizations: Not on file  . Attends Archivist Meetings: Not on file  . Marital Status: Not on file     Family History: The patient's family history includes CAD in his mother; Ovarian cancer in his sister; Pancreatic cancer in his brother; Stroke in his mother. ROS:   Please see the history of present illness.    All other systems reviewed and are negative.  EKGs/Labs/Other Studies Reviewed:    The following studies were reviewed today:  EKG:  EKG ordered today and personally reviewed.  The ekg ordered today demonstrates sinus rhythm normal EKG  Recent Labs: 05/06/2020: ALT 24; Hemoglobin 13.1; Platelets 237 05/08/2020: BUN 11; Creatinine, Ser 0.82; Magnesium 1.7; Potassium 3.7; Sodium 138; TSH 4.238  Recent Lipid Panel No results found for: CHOL, TRIG, HDL, CHOLHDL, VLDL, LDLCALC, LDLDIRECT  Physical Exam:    VS:  BP (!) 146/88   Pulse 83   Ht 6\' 1"  (1.854 m)   Wt 284 lb (128.8 kg)   SpO2 95%   BMI 37.47 kg/m     Wt Readings from Last 3 Encounters:  08/11/20 284 lb (128.8 kg)  07/08/20 278 lb (126.1 kg)  05/05/20 280 lb (127 kg)     GEN:  W spokenew nourished,  well developed in no acute distress HEENT: Normal NECK: No JVD; No carotid bruits LYMPHATICS: No lymphadenopathy CARDIAC: AsRRR, no murmurs, rubs, gallops RESPIRATORY:  Clear to auscultation without rales, wheezing or rhonchi  ABDOMEN: Soft, non-tender, non-distended MUSCULOSKELETAL:  No edema; No deformity  SKIN: Warm and dry NEUROLOGIC:  Alert and oriented x 3 PSYCHIATRIC:  Normal affect    Signed, Shirlee More, MD  08/11/2020 4:42 PM     [facility]

## 2020-08-11 NOTE — Patient Instructions (Signed)

## 2020-08-25 ENCOUNTER — Encounter: Payer: Self-pay | Admitting: Cardiology

## 2020-08-25 ENCOUNTER — Other Ambulatory Visit: Payer: Self-pay

## 2020-08-25 ENCOUNTER — Ambulatory Visit: Payer: Medicare Other | Admitting: Cardiology

## 2020-08-25 VITALS — BP 110/80 | HR 79 | Ht 73.0 in | Wt 285.0 lb

## 2020-08-25 DIAGNOSIS — I480 Paroxysmal atrial fibrillation: Secondary | ICD-10-CM

## 2020-08-25 DIAGNOSIS — Z01812 Encounter for preprocedural laboratory examination: Secondary | ICD-10-CM

## 2020-08-25 DIAGNOSIS — I4819 Other persistent atrial fibrillation: Secondary | ICD-10-CM

## 2020-08-25 DIAGNOSIS — R0683 Snoring: Secondary | ICD-10-CM | POA: Diagnosis not present
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
## 2020-08-25 NOTE — Progress Notes (Signed)
Patient Name: Raymond Powell         DOB: 25-Aug-1956     Height: 6'1"    Weight: 285 lb  Office Name: [facility]         Referring Provider: Allegra Lai, M.D.  Today's Date: 08/25/2020  Date:   STOP BANG RISK ASSESSMENT S (snore) Have you been told that you snore?     YES   T (tired) Are you often tired, fatigued, or sleepy during the day?   YES  O (obstruction) Do you stop breathing, choke, or gasp during sleep? NO   P (pressure) Do you have or are you being treated for high blood pressure? NO   B (BMI) Is your body index greater than 35 kg/m? YES   A (age) Are you 29 years old or older? YES   N (neck) Do you have a neck circumference greater than 16 inches?   YES   G (gender) Are you a male? YES   TOTAL STOP/BANG "YES" ANSWERS                                                                        For Office Use Only              Procedure Order Form    YES to 3+ Stop Bang questions OR two clinical symptoms - patient qualifies for WatchPAT (CPT 95800)     Submit: This Form + Patient Face Sheet + Clinical Note via CloudPAT or Fax: [phone]         Clinical Notes: Will consult Sleep Specialist and refer for management of therapy due to patient increased risk of Sleep Apnea. Ordering a sleep study due to the following two clinical symptoms: Excessive daytime sleepiness G47.10 / Gastroesophageal reflux K21.9 / Nocturia R35.1 / Morning Headaches G44.221 / Difficulty concentrating R41.840 / Memory problems or poor judgment G31.84 / Personality changes or irritability R45.4 / Loud snoring R06.83 / Depression F32.9 / Unrefreshed by sleep G47.8 / Impotence N52.9 / History of high blood pressure R03.0 / Insomnia G47.00    I understand that I am proceeding with a home sleep apnea test as ordered by my treating physician. I understand that untreated sleep apnea is a serious cardiovascular risk factor and it is my responsibility to perform the test and seek management for sleep  apnea. I will be contacted with the results and be managed for sleep apnea by a local sleep physician. I will be receiving equipment and further instructions from [facility]. I shall promptly ship back the equipment via the included mailing label. I understand my insurance will be billed for the test and as the patient I am responsible for any insurance related out-of-pocket costs incurred. I have been provided with written instructions and can call for additional video or telephonic instruction, with 24-hour availability of qualified personnel to answer any questions: Patient Help Desk [phone].  Patient Signature ______________________________________________________   Date______________________ Patient Telemedicine Verbal Consent

## 2020-08-25 NOTE — Patient Instructions (Addendum)
Medication Instructions:  Your physician recommends that you continue on your current medications as directed. Please refer to the Current Medication list given to you today.  *If you need a refill on your cardiac medications before your next appointment, please call your pharmacy*   Lab Work: Pre procedure labs in [place] between 11/8 - 11/24: BMET & CBC If you have labs (blood work) drawn today and your tests are completely normal, you will receive your results only by:  [facility] (if you have MyChart) OR  A paper copy in the mail If you have any lab test that is abnormal or we need to change your treatment, we will call you to review the results.   Testing/Procedures: Your physician has recommended that you have a home sleep study. This test records several body functions during sleep, including: brain activity, eye movement, oxygen and carbon dioxide blood levels, heart rate and rhythm, breathing rate and rhythm, the flow of air through your mouth and nose, snoring, body muscle movements, and chest and belly movement.  Your physician has requested that you have cardiac CT within 7 days PRIOR to your ablation. Cardiac computed tomography (CT) is a painless test that uses an x-ray machine to take clear, detailed pictures of your heart.  Please follow instructions below located under "other instructions".   Your physician has recommended that you have an ablation. Catheter ablation is a medical procedure used to treat some cardiac arrhythmias (irregular heartbeats). During catheter ablation, a long, thin, flexible tube is put into a blood vessel in your groin (upper thigh), or neck. This tube is called an ablation catheter. It is then guided to your heart through the blood vessel. Radio frequency waves destroy small areas of heart tissue where abnormal heartbeats may cause an arrhythmia to start. Please follow instructions below located under "other instructions".     Follow-Up: At [facility] you and your health needs are our priority. As part of our continuing mission to provide you with exceptional heart care, we have created designated Provider Care Teams. These Care Teams include your primary Cardiologist (physician) and Advanced Practice Providers (APPs -  Physician Assistants and Nurse Practitioners) who all work together to provide you with the care you need, when you need it.  Your next appointment:   4 week(s) after your ablation on 10/14/20  The format for your next appointment:   In Person  Provider:   You will follow up in the [facility] located at [facility]. Your provider will be: Roderic Palau, NP or Clint R. Fenton, PA-C    Thank you for choosing [facility]!!   Trinidad Curet, RN [phone]   Other Instructions   CT INSTRUCTIONS Your cardiac CT will be scheduled at:  [facility] [address] [place], [place] [address] [phone]  Please arrive at the [facility] main entrance of [facility] 30 minutes prior to test start time on _________________ Proceed to the [facility] Radiology Department (first floor) to check-in and test prep.  Please follow these instructions carefully (unless otherwise directed):  Hold all erectile dysfunction medications at least 3 days (72 hrs) prior to test.  On the Night Before the Test:  Be sure to Drink plenty of water.  Do not consume any caffeinated/decaffeinated beverages or chocolate 12 hours prior to your test.  Do not take any antihistamines 12 hours prior to your test.  On the Day of the Test:  Drink plenty of water.  Do not drink any water within one hour of the test.  Do not eat any food 4 hours prior to the test.  You may take your regular medications prior to the test.   Take metoprolol (Lopressor) 100 mg two hours prior to test. DO NOT take your Atenolol  HOLD  Furosemide/Hydrochlorothiazide morning of the test.      After the Test:  Drink plenty of water.  After receiving IV contrast, you may experience a mild flushed feeling. This is normal.  On occasion, you may experience a mild rash up to 24 hours after the test. This is not dangerous. If this occurs, you can take Benadryl 25 mg and increase your fluid intake.  If you experience trouble breathing, this can be serious. If it is severe call 911 IMMEDIATELY. If it is mild, please call our office.  If you take any of these medications: Glipizide/Metformin, Avandament, Glucavance, please do not take 48 hours after completing test unless otherwise instructed.   Once we have confirmed authorization from your insurance company, we will call you to set up a date and time for your test. Based on how quickly your insurance processes prior authorizations requests, please allow up to 4 weeks to be contacted for scheduling your Cardiac CT appointment. Be advised that routine Cardiac CT appointments could be scheduled as many as 8 weeks after your provider has ordered it.  For non-scheduling related questions, please contact the cardiac imaging nurse navigator should you have any questions/concerns: Marchia Bond, Cardiac Imaging Nurse Navigator Burley Saver, Interim Cardiac Imaging Nurse [place] [facility] Services Direct Office Dial: [phone]   For scheduling needs, including cancellations and rescheduling, please call Vivien Rota at [phone], option [phone].     Electrophysiology/Ablation Procedure Instructions  You are scheduled for a(n)  ablationon 10/14/2020 with Dr.Will Camnitz.  1. Pre procedure testing- A. LAB WORK ([place] office) --- between 11/08 - 11/24 for your pre procedure blood work. You do NOT need to be fasting.  B. COVID TEST-- On 10/12/2020 @ 2:45 pm - This is a Drive Up Visit at [address] [place], [place] [address].   Someone will direct you to the appropriate testing line. Stay in your car and someone will be with you shortly.  After you are tested please go home and self quarantine until the day of your procedure.   2. On the day of your procedure 10/14/2020 you will go to [facility] [id number]) at 5:30 am. Dennis Bast will go to the main entrance A [employer]) and enter where the [employer] are. Your driver will drop you off and you will head down the hallway to ADMITTING. You may have one support person come in to the hospital with you.  They will be asked to wait in the waiting room.  3. Do not eat or drink after midnight prior to your procedure.  4. Do not miss any doses of your blood thinner prior to the morning of your procedure or your procedure will need to be rescheduled.       Do NOT take any medications the morning of your procedure.  5. Plan for an overnight stay, but you may be discharged home after your procedure.   If you use your phone frequently bring your phone charger, in case you have to stay.  If you are discharged after your procedure you will need someone to drive you home and be with your for 24 hours after  your procedure.  6. You will follow up with the AFIB clinic 4 weeksafter your procedure. You will follow up with Dr. Curt Bears  3 monthsafter your procedure. These appointments will be made for you.  * If you have ANY questions please call the office [phone] and ask for Jacqlyn Marolf RNor send me a MyChart message  * Occasionally, EP Studies and ablations can become lengthy. Please make your family aware of this before your procedure starts. Average time ranges from 2-8 hours for EP studies/ablations. Your physician will [place] family after the procedure with the results.    Cardiac Ablation Cardiac ablation is a procedure to disable (ablate) a small amount of heart tissue in very specific places. The  heart has many electrical connections. Sometimes these connections are abnormal and can cause the heart to beat very fast or irregularly. Ablating some of the problem areas can improve the heart rhythm or return it to normal. Ablation may be done for people who:  Have Wolff-Parkinson-White syndrome.  Have fast heart rhythms (tachycardia).  Have taken medicines for an abnormal heart rhythm (arrhythmia) that were not effective or caused side effects.  Have a high-risk heartbeat that may be life-threatening. During the procedure, a small incision is made in the neck or the groin, and a long, thin, flexible tube (catheter) is inserted into the incision and moved to the heart. Small devices (electrodes) on the tip of the catheter will send out electrical currents. A type of X-ray (fluoroscopy) will be used to help guide the catheter and to provide images of the heart. Tell a health care provider about:  Any allergies you have.  All medicines you are taking, including vitamins, herbs, eye drops, creams, and over-the-counter medicines.  Any problems you or family members have had with anesthetic medicines.  Any blood disorders you have.  Any surgeries you have had.  Any medical conditions you have, such as kidney failure.  Whether you are pregnant or may be pregnant. What are the risks? Generally, this is a safe procedure. However, problems may occur, including:  Infection.  Bruising and bleeding at the catheter insertion site.  Bleeding into the chest, especially into the sac that surrounds the heart. This is a serious complication.  Stroke or blood clots.  Damage to other structures or organs.  Allergic reaction to medicines or dyes.  Need for a permanent pacemaker if the normal electrical system is damaged. A pacemaker is a small computer that sends electrical signals to the heart and helps your heart beat normally.  The procedure not being fully effective. This may not be  recognized until months later. Repeat ablation procedures are sometimes required. What happens before the procedure?  Follow instructions from your health care provider about eating or drinking restrictions.  Ask your health care provider about: ? Changing or stopping your regular medicines. This is especially important if you are taking diabetes medicines or blood thinners. ? Taking medicines such as aspirin and ibuprofen. These medicines can thin your blood. Do not take these medicines before your procedure if your health care provider instructs you not to.  Plan to have someone take you home from the hospital or clinic.  If you will be going home right after the procedure, plan to have someone with you for 24 hours. What happens during the procedure?  To lower your risk of infection: ? Your health care team will wash or sanitize their hands. ? Your skin will be washed with soap. ? Hair may be  removed from the incision area.  An IV tube will be inserted into one of your veins.  You will be given a medicine to help you relax (sedative).  The skin on your neck or groin will be numbed.  An incision will be made in your neck or your groin.  A needle will be inserted through the incision and into a large vein in your neck or groin.  A catheter will be inserted into the needle and moved to your heart.  Dye may be injected through the catheter to help your surgeon see the area of the heart that needs treatment.  Electrical currents will be sent from the catheter to ablate heart tissue in desired areas. There are three types of energy that may be used to ablate heart tissue: ? Heat (radiofrequency energy). ? Laser energy. ? Extreme cold (cryoablation).  When the necessary tissue has been ablated, the catheter will be removed.  Pressure will be held on the catheter insertion area to prevent excessive bleeding.  A bandage (dressing) will be placed over the catheter insertion  area. The procedure may vary among health care providers and hospitals. What happens after the procedure?  Your blood pressure, heart rate, breathing rate, and blood oxygen level will be monitored until the medicines you were given have worn off.  Your catheter insertion area will be monitored for bleeding. You will need to lie still for a few hours to ensure that you do not bleed from the catheter insertion area.  Do not drive for 24 hours or as long as directed by your health care provider. Summary  Cardiac ablation is a procedure to disable (ablate) a small amount of heart tissue in very specific places. Ablating some of the problem areas can improve the heart rhythm or return it to normal.  During the procedure, electrical currents will be sent from the catheter to ablate heart tissue in desired areas. This information is not intended to replace advice given to you by your health care provider. Make sure you discuss any questions you have with your health care provider. Document Revised: 04/21/2018 Document Reviewed: 09/17/2016 Elsevier Patient Education  [place]. [email or web address]

## 2020-09-07 DIAGNOSIS — E119 Type 2 diabetes mellitus without complications: Secondary | ICD-10-CM | POA: Diagnosis not present

## 2020-09-07 DIAGNOSIS — H2513 Age-related nuclear cataract, bilateral: Secondary | ICD-10-CM | POA: Diagnosis not present

## 2020-09-09 ENCOUNTER — Telehealth: Payer: Self-pay | Admitting: Cardiology

## 2020-09-09 NOTE — Telephone Encounter (Signed)
Raymond Powell from [facility] wanted to let Trinidad Curet know that they can move their procedure to Friday the 5th or even 11th if need be.   Please call/advise.   Thank you!

## 2020-09-09 NOTE — Telephone Encounter (Signed)
Pt awaiting ortho to call him back about moving procedure date up. States they aren't sure MD can do 11/5 now, so they are looking into this futher. Aware to have ortho office/him update me once date is set for ablation. Pt agreeable to plan.

## 2020-09-09 NOTE — Telephone Encounter (Signed)
Left message w/ Jeani Hawking at [facility] that Dr. Curt Bears is agreeable if pt needs to hold Eliquis for back ablation, but would prefer they move date up to 11/5 so that he can be restarted on Eliquis for Afib ablation 12/3.

## 2020-09-12 NOTE — Telephone Encounter (Signed)
lyn with [facility] is following up. She states the patient will no longer need to hold Eliquis for the procedure, so they will keep the injection on 09/30/20. She requested a call back from Dr. Macky Lower nurse to verify everything.  Phone number: [phone] (ext: [phone])

## 2020-09-13 NOTE — Telephone Encounter (Signed)
Spoke to [employer]. Dr. Curt Bears made aware.

## 2020-09-15 ENCOUNTER — Other Ambulatory Visit: Payer: Self-pay | Admitting: *Deleted

## 2020-09-15 DIAGNOSIS — I4819 Other persistent atrial fibrillation: Secondary | ICD-10-CM

## 2020-09-15 DIAGNOSIS — I480 Paroxysmal atrial fibrillation: Secondary | ICD-10-CM

## 2020-09-15 DIAGNOSIS — Z01812 Encounter for preprocedural laboratory examination: Secondary | ICD-10-CM

## 2020-09-16 ENCOUNTER — Telehealth: Payer: Self-pay | Admitting: *Deleted

## 2020-09-16 NOTE — Telephone Encounter (Signed)
Pt made aware his procedure has been moved to 2nd case. Aware to arrive at 8:30 am on the morning of 12/3 (not 5:30 am as originally scheduled). Aware that 11/16 virtual visit for H&P cancelled. Patient verbalized understanding and agreeable to plan.

## 2020-09-20 DIAGNOSIS — I480 Paroxysmal atrial fibrillation: Secondary | ICD-10-CM | POA: Diagnosis not present

## 2020-09-20 DIAGNOSIS — Z01812 Encounter for preprocedural laboratory examination: Secondary | ICD-10-CM | POA: Diagnosis not present

## 2020-09-20 DIAGNOSIS — I4819 Other persistent atrial fibrillation: Secondary | ICD-10-CM | POA: Diagnosis not present

## 2020-09-20 NOTE — Addendum Note (Signed)
Addended by: Stanton Kidney on: 09/20/2020 10:22 AM   Modules accepted: Orders

## 2020-09-21 LAB — CBC
Hematocrit: 44.2 % (ref 37.5–51.0)
Hemoglobin: 14.8 g/dL (ref 13.0–17.7)
MCH: 28.6 pg (ref 26.6–33.0)
MCHC: 33.5 g/dL (ref 31.5–35.7)
MCV: 85 fL (ref 79–97)
Platelets: 278 10*3/uL (ref 150–450)
RBC: 5.18 x10E6/uL (ref 4.14–5.80)
RDW: 13.7 % (ref 11.6–15.4)
WBC: 8.8 10*3/uL (ref 3.4–10.8)

## 2020-09-21 LAB — BASIC METABOLIC PANEL
BUN/Creatinine Ratio: 20 (ref 10–24)
BUN: 17 mg/dL (ref 8–27)
CO2: 23 mmol/L (ref 20–29)
Calcium: 9.5 mg/dL (ref 8.6–10.2)
Chloride: 101 mmol/L (ref 96–106)
Creatinine, Ser: 0.86 mg/dL (ref 0.76–1.27)
GFR calc Af Amer: 105 mL/min/{1.73_m2} (ref >59)
GFR calc non Af Amer: 91 mL/min/{1.73_m2} (ref >59)
Glucose: 121 mg/dL — ABNORMAL HIGH (ref 65–99)
Potassium: 4.8 mmol/L (ref 3.5–5.2)
Sodium: 140 mmol/L (ref 134–144)

## 2020-09-27 ENCOUNTER — Telehealth: Payer: Medicare Other | Admitting: Cardiology

## 2020-09-30 DIAGNOSIS — M47896 Other spondylosis, lumbar region: Secondary | ICD-10-CM | POA: Diagnosis not present

## 2020-09-30 DIAGNOSIS — M47817 Spondylosis without myelopathy or radiculopathy, lumbosacral region: Secondary | ICD-10-CM | POA: Diagnosis not present

## 2020-10-04 ENCOUNTER — Telehealth: Payer: Self-pay | Admitting: Cardiology

## 2020-10-04 MED ORDER — METOPROLOL TARTRATE 100 MG PO TABS: ORAL_TABLET | ORAL | 0 refills | Status: DC

## 2020-10-04 NOTE — Telephone Encounter (Signed)
Raymond Powell is calling requesting to speak with [name] in regards to when his medication for his upcoming CT will be called in. Please advise.

## 2020-10-04 NOTE — Telephone Encounter (Signed)
Made aware Rx sent.

## 2020-10-04 NOTE — Addendum Note (Signed)
Addended by: Stanton Kidney on: 10/04/2020 10:47 AM   Modules accepted: Orders

## 2020-10-05 ENCOUNTER — Telehealth (HOSPITAL_COMMUNITY): Payer: Self-pay | Admitting: Emergency Medicine

## 2020-10-05 NOTE — Telephone Encounter (Signed)
Reaching out to patient to offer assistance regarding upcoming cardiac imaging study; pt verbalizes understanding of appt date/time, parking situation and where to check in, pre-test NPO status and medications ordered, and verified current allergies; name and call back number provided for further questions should they arise Marchia Bond RN Navigator Cardiac Imaging [place] and V[facility] [phone] office [phone] cell   100mg  metoprolol tartrate 2 hr prior to scan

## 2020-10-07 ENCOUNTER — Encounter (HOSPITAL_COMMUNITY): Payer: Self-pay

## 2020-10-07 ENCOUNTER — Ambulatory Visit (HOSPITAL_COMMUNITY)
Admission: RE | Admit: 2020-10-07 | Discharge: 2020-10-07 | Disposition: A | Discharge status: Home / Self Care | Payer: Medicare Other | Source: Ambulatory Visit | Attending: Cardiology | Admitting: Cardiology

## 2020-10-07 ENCOUNTER — Other Ambulatory Visit: Payer: Self-pay

## 2020-10-07 DIAGNOSIS — I4819 Other persistent atrial fibrillation: Secondary | ICD-10-CM | POA: Diagnosis not present

## 2020-10-07 MED ORDER — IOHEXOL 350 MG/ML SOLN
80.0000 mL | Freq: Once | INTRAVENOUS | Status: AC | PRN
  Administered 2020-10-07: 80 mL via INTRAVENOUS

## 2020-10-07 NOTE — Discharge Instructions (Signed)
Cardiac CT Angiogram A cardiac CT angiogram is a procedure to look at the heart and the area around the heart. It may be done to help find the cause of chest pains or other symptoms of heart disease. During this procedure, a substance called contrast dye is injected into the blood vessels in the area to be checked. A large X-ray machine, called a CT scanner, then takes detailed pictures of the heart and the surrounding area. The procedure is also sometimes called a coronary CT angiogram, coronary artery scanning, or CTA. A cardiac CT angiogram allows the health care provider to see how well blood is flowing to and from the heart. The health care provider will be able to see if there are any problems, such as:  Blockage or narrowing of the coronary arteries in the heart.  Fluid around the heart.  Signs of weakness or disease in the muscles, valves, and tissues of the heart. Tell a health care provider about:  Any allergies you have. This is especially important if you have had a previous allergic reaction to contrast dye.  All medicines you are taking, including vitamins, herbs, eye drops, creams, and over-the-counter medicines.  Any blood disorders you have.  Any surgeries you have had.  Any medical conditions you have.  Whether you are pregnant or may be pregnant.  Any anxiety disorders, chronic pain, or other conditions you have that may increase your stress or prevent you from lying still. What are the risks? Generally, this is a safe procedure. However, problems may occur, including:  Bleeding.  Infection.  Allergic reactions to medicines or dyes.  Damage to other structures or organs.  Kidney damage from the contrast dye that is used.  Increased risk of cancer from radiation exposure. This risk is low. Talk with your health care provider about: ? The risks and benefits of testing. ? How you can receive the lowest dose of radiation. What happens before the  procedure?  Wear comfortable clothing and remove any jewelry, glasses, dentures, and hearing aids.  Follow instructions from your health care provider about eating and drinking. This may include: ? For 12 hours before the procedure -- avoid caffeine. This includes tea, coffee, soda, energy drinks, and diet pills. Drink plenty of water or other fluids that do not have caffeine in them. Being well hydrated can prevent complications. ? For 4-6 hours before the procedure -- stop eating and drinking. The contrast dye can cause nausea, but this is less likely if your stomach is empty.  Ask your health care provider about changing or stopping your regular medicines. This is especially important if you are taking diabetes medicines, blood thinners, or medicines to treat problems with erections (erectile dysfunction). What happens during the procedure?   Hair on your chest may need to be removed so that small sticky patches called electrodes can be placed on your chest. These will transmit information that helps to monitor your heart during the procedure.  An IV will be inserted into one of your veins.  You might be given a medicine to control your heart rate during the procedure. This will help to ensure that good images are obtained.  You will be asked to lie on an exam table. This table will slide in and out of the CT machine during the procedure.  Contrast dye will be injected into the IV. You might feel warm, or you may get a metallic taste in your mouth.  You will be given a medicine called   nitroglycerin. This will relax or dilate the arteries in your heart.  The table that you are lying on will move into the CT machine tunnel for the scan.  The person running the machine will give you instructions while the scans are being done. You may be asked to: ? Keep your arms above your head. ? Hold your breath. ? Stay very still, even if the table is moving.  When the scanning is complete, you  will be moved out of the machine.  The IV will be removed. The procedure may vary among health care providers and hospitals. What can I expect after the procedure? After your procedure, it is common to have:  A metallic taste in your mouth from the contrast dye.  A feeling of warmth.  A headache from the nitroglycerin. Follow these instructions at home:  Take over-the-counter and prescription medicines only as told by your health care provider.  If you are told, drink enough fluid to keep your urine pale yellow. This will help to flush the contrast dye out of your body.  Most people can return to their normal activities right after the procedure. Ask your health care provider what activities are safe for you.  It is up to you to get the results of your procedure. Ask your health care provider, or the department that is doing the procedure, when your results will be ready.  Keep all follow-up visits as told by your health care provider. This is important. Contact a health care provider if:  You have any symptoms of allergy to the contrast dye. These include: ? Shortness of breath. ? Rash or hives. ? A racing heartbeat. Summary  A cardiac CT angiogram is a procedure to look at the heart and the area around the heart. It may be done to help find the cause of chest pains or other symptoms of heart disease.  During this procedure, a large X-ray machine, called a CT scanner, takes detailed pictures of the heart and the surrounding area after a contrast dye has been injected into blood vessels in the area.  Ask your health care provider about changing or stopping your regular medicines before the procedure. This is especially important if you are taking diabetes medicines, blood thinners, or medicines to treat erectile dysfunction.  If you are told, drink enough fluid to keep your urine pale yellow. This will help to flush the contrast dye out of your body. This information is not  intended to replace advice given to you by your health care provider. Make sure you discuss any questions you have with your health care provider. Document Revised: 06/24/2019 Document Reviewed: 06/24/2019 Elsevier Patient Education  2020 [employer] Testing With IV Contrast Material IV contrast material is a fluid that is used with some imaging tests. It is injected into your body through a vein. Contrast material is used when your health care providers need a detailed look at organs, tissues, or blood vessels that may not show up with the standard test. The material may be used when an X-ray, an MRI, a CT scan, or an ultrasound is done. IV contrast material may be used for imaging tests that check:  Muscles, skin, and fat.  Breasts.  Brain.  Digestive tract.  Heart.  Organs such as the liver, kidneys, lungs, bladder, and many others.  Arteries and veins. Tell a health care provider about:  Any allergies you have, especially an allergy to contrast material.  All medicines you are taking, including   metformin, beta blockers, NSAIDs (such as ibuprofen), interleukin-2, vitamins, herbs, eye drops, creams, and over-the-counter medicines.  Any problems you or family members have had with the use of contrast material.  Any blood disorders you have, such as sickle cell anemia.  Any surgeries you have had.  Any medical conditions you have or have had, especially alcohol abuse, dehydration, asthma, or kidney, liver, or heart problems.  Whether you are pregnant or may be pregnant.  Whether you are breastfeeding. Most contrast materials are safe for use in breastfeeding women. What are the risks? Generally, this is a safe procedure. However, problems may occur, including:  Headache.  Itching, skin rash, and hives.  Nausea and vomiting.  Allergic reactions.  Wheezing or difficulty breathing.  Abnormal heart rate.  Changes in blood pressure.  Throat swelling.  Kidney  damage. What happens before the procedure? Medicines Ask your health care provider about:  Changing or stopping your regular medicines. This is especially important if you are taking diabetes medicines or blood thinners.  Taking medicines such as aspirin and ibuprofen. These medicines can thin your blood. Do not take these medicines unless your health care provider tells you to take them.  Taking over-the-counter medicines, vitamins, herbs, and supplements. If you are at risk of having a reaction to the IV contrast material, you may be asked to take medicine before the procedure to prevent a reaction. General instructions  Follow instructions from your health care provider about eating or drinking restrictions.  You may have an exam or lab tests to make sure that you can safely get IV contrast material.  Ask if you will be given a medicine to help you relax (sedative) during the procedure. If so, plan to have someone take you home from the hospital or clinic. What happens during the procedure?  You may be given a sedative to help you relax.  An IV will be inserted into one of your veins.  Contrast material will be injected into your IV.  You may feel warmth or flushing as the contrast material enters your bloodstream.  You may have a metallic taste in your mouth for a few minutes.  The needle may cause some discomfort and bruising.  After the contrast material is in your body, the imaging test will be done. The procedure may vary among health care providers and hospitals. What can I expect after the procedure?  The IV will be removed.  You may be taken to a recovery area if sedation medicines were used. Your blood pressure, heart rate, breathing rate, and blood oxygen level will be monitored until you leave the hospital or clinic. Follow these instructions at home:   Take over-the-counter and prescription medicines only as told by your health care provider. ? Your health  care provider may tell you to not take certain medicines for a couple of days after the procedure. This is especially important if you are taking diabetes medicines.  If you are told, drink enough fluid to keep your urine pale yellow. This will help to remove the contrast material out of your body.  Do not drive for 24 hours if you were given a sedative during your procedure.  It is up to you to get the results of your procedure. Ask your health care provider, or the department that is doing the procedure, when your results will be ready.  Keep all follow-up visits as told by your health care provider. This is important. Contact a health care provider if:    You have redness, swelling, or pain near your IV site. Get help right away if:  You have an abnormal heart rhythm.  You have trouble breathing.  You have: ? Chest pain. ? Pain in your back, neck, arm, jaw, or stomach. ? Nausea or sweating. ? Hives or a rash.  You start shaking and cannot stop. These symptoms may represent a serious problem that is an emergency. Do not wait to see if the symptoms will go away. Get medical help right away. Call your local emergency services (911 in the [place] Do not drive yourself to the hospital. Summary  IV contrast material may be used for imaging tests to help your health care providers see your organs and tissues more clearly.  Tell your health care provider if you are pregnant or may be pregnant.  During the procedure, you may feel warmth or flushing as the contrast material enters your bloodstream.  After the procedure, drink enough fluid to keep your urine pale yellow. This information is not intended to replace advice given to you by your health care provider. Make sure you discuss any questions you have with your health care provider. Document Revised: 01/15/2019 Document Reviewed: 01/15/2019 Elsevier Patient Education  2020 Else[employer] Inc.  

## 2020-10-11 DIAGNOSIS — K219 Gastro-esophageal reflux disease without esophagitis: Secondary | ICD-10-CM | POA: Diagnosis not present

## 2020-10-11 DIAGNOSIS — E7800 Pure hypercholesterolemia, unspecified: Secondary | ICD-10-CM | POA: Diagnosis not present

## 2020-10-11 DIAGNOSIS — I10 Essential (primary) hypertension: Secondary | ICD-10-CM | POA: Diagnosis not present

## 2020-10-12 ENCOUNTER — Other Ambulatory Visit (HOSPITAL_COMMUNITY)
Admission: RE | Admit: 2020-10-12 | Discharge: 2020-10-12 | Disposition: A | Discharge status: Home / Self Care | Payer: Medicare Other | Source: Ambulatory Visit | Attending: Cardiology | Admitting: Cardiology

## 2020-10-12 DIAGNOSIS — Z01812 Encounter for preprocedural laboratory examination: Secondary | ICD-10-CM | POA: Insufficient documentation

## 2020-10-12 DIAGNOSIS — Z20822 Contact with and (suspected) exposure to covid-19: Secondary | ICD-10-CM | POA: Diagnosis not present

## 2020-10-12 LAB — SARS CORONAVIRUS 2 (TAT 6-24 HRS): SARS Coronavirus 2: NEGATIVE

## 2020-10-13 NOTE — Progress Notes (Signed)
Instructed patient on the following items: Arrival time 0830 Nothing to eat or drink after midnight No meds AM of procedure Responsible person to drive you home and stay with you for 24 hrs  Have you missed any doses of anti-coagulant Eliquis- hasn't missed any doses   

## 2020-10-14 ENCOUNTER — Other Ambulatory Visit: Payer: Self-pay

## 2020-10-14 ENCOUNTER — Encounter (HOSPITAL_COMMUNITY)
Admission: RE | Disposition: A | Discharge status: Home / Self Care | Payer: Self-pay | Source: Home / Self Care | Attending: Cardiology

## 2020-10-14 ENCOUNTER — Encounter (HOSPITAL_COMMUNITY): Payer: Self-pay | Admitting: Cardiology

## 2020-10-14 ENCOUNTER — Ambulatory Visit (HOSPITAL_COMMUNITY): Payer: Medicare Other | Admitting: Certified Registered Nurse Anesthetist

## 2020-10-14 ENCOUNTER — Ambulatory Visit (HOSPITAL_COMMUNITY)
Admission: RE | Admit: 2020-10-14 | Discharge: 2020-10-14 | Disposition: A | Discharge status: Home / Self Care | Payer: Medicare Other | Attending: Cardiology | Admitting: Cardiology

## 2020-10-14 DIAGNOSIS — Z87891 Personal history of nicotine dependence: Secondary | ICD-10-CM | POA: Insufficient documentation

## 2020-10-14 DIAGNOSIS — E785 Hyperlipidemia, unspecified: Secondary | ICD-10-CM | POA: Diagnosis not present

## 2020-10-14 DIAGNOSIS — Z885 Allergy status to narcotic agent status: Secondary | ICD-10-CM | POA: Diagnosis not present

## 2020-10-14 DIAGNOSIS — I4892 Unspecified atrial flutter: Secondary | ICD-10-CM | POA: Diagnosis not present

## 2020-10-14 DIAGNOSIS — I10 Essential (primary) hypertension: Secondary | ICD-10-CM | POA: Diagnosis not present

## 2020-10-14 DIAGNOSIS — Z8249 Family history of ischemic heart disease and other diseases of the circulatory system: Secondary | ICD-10-CM | POA: Diagnosis not present

## 2020-10-14 DIAGNOSIS — I4891 Unspecified atrial fibrillation: Secondary | ICD-10-CM | POA: Diagnosis not present

## 2020-10-14 DIAGNOSIS — I480 Paroxysmal atrial fibrillation: Secondary | ICD-10-CM | POA: Diagnosis not present

## 2020-10-14 DIAGNOSIS — E119 Type 2 diabetes mellitus without complications: Secondary | ICD-10-CM | POA: Diagnosis not present

## 2020-10-14 DIAGNOSIS — K219 Gastro-esophageal reflux disease without esophagitis: Secondary | ICD-10-CM | POA: Diagnosis not present

## 2020-10-14 DIAGNOSIS — I483 Typical atrial flutter: Secondary | ICD-10-CM | POA: Diagnosis not present

## 2020-10-14 HISTORY — PX: ATRIAL FIBRILLATION ABLATION: EP1191

## 2020-10-14 LAB — GLUCOSE, CAPILLARY
Glucose-Capillary: 125 mg/dL — ABNORMAL HIGH (ref 70–99)
Glucose-Capillary: 156 mg/dL — ABNORMAL HIGH (ref 70–99)

## 2020-10-14 LAB — POCT ACTIVATED CLOTTING TIME
Activated Clotting Time: 261 seconds
Activated Clotting Time: 297 seconds
Activated Clotting Time: 333 seconds

## 2020-10-14 SURGERY — ATRIAL FIBRILLATION ABLATION
Anesthesia: General

## 2020-10-14 MED ORDER — SODIUM CHLORIDE 0.9 % IV SOLN: INTRAVENOUS | Status: DC

## 2020-10-14 MED ORDER — PROTAMINE SULFATE 10 MG/ML IV SOLN
INTRAVENOUS | Status: DC | PRN
  Administered 2020-10-14: 40 mg via INTRAVENOUS

## 2020-10-14 MED ORDER — HEPARIN SODIUM (PORCINE) 1000 UNIT/ML IJ SOLN
INTRAMUSCULAR | Status: DC | PRN
  Administered 2020-10-14: 15000 [IU] via INTRAVENOUS
  Administered 2020-10-14: 4000 [IU] via INTRAVENOUS
  Administered 2020-10-14: 6000 [IU] via INTRAVENOUS
  Administered 2020-10-14: 2000 [IU] via INTRAVENOUS
  Administered 2020-10-14: 4000 [IU] via INTRAVENOUS

## 2020-10-14 MED ORDER — OXYCODONE HCL 5 MG PO TABS
5.0000 mg | ORAL_TABLET | Freq: Every day | ORAL | Status: DC
  Filled 2020-10-14 (×2): qty 1

## 2020-10-14 MED ORDER — PROPOFOL 10 MG/ML IV BOLUS
INTRAVENOUS | Status: DC | PRN
  Administered 2020-10-14: 200 mg via INTRAVENOUS

## 2020-10-14 MED ORDER — OXYCODONE HCL 5 MG PO TABS
5.0000 mg | ORAL_TABLET | Freq: Every day | ORAL | Status: DC
  Administered 2020-10-14: 5 mg via ORAL

## 2020-10-14 MED ORDER — DEXAMETHASONE SODIUM PHOSPHATE 10 MG/ML IJ SOLN
INTRAMUSCULAR | Status: DC | PRN
  Administered 2020-10-14: 4 mg via INTRAVENOUS

## 2020-10-14 MED ORDER — MIDAZOLAM HCL 5 MG/5ML IJ SOLN
INTRAMUSCULAR | Status: DC | PRN
  Administered 2020-10-14: 2 mg via INTRAVENOUS

## 2020-10-14 MED ORDER — HEPARIN SODIUM (PORCINE) 1000 UNIT/ML IJ SOLN
INTRAMUSCULAR | Status: DC | PRN
  Administered 2020-10-14: 1000 [IU] via INTRAVENOUS

## 2020-10-14 MED ORDER — DOBUTAMINE IN D5W 4-5 MG/ML-% IV SOLN
INTRAVENOUS | Status: DC | PRN
  Administered 2020-10-14: 20 ug/kg/min via INTRAVENOUS

## 2020-10-14 MED ORDER — PHENYLEPHRINE 40 MCG/ML (10ML) SYRINGE FOR IV PUSH (FOR BLOOD PRESSURE SUPPORT)
PREFILLED_SYRINGE | INTRAVENOUS | Status: DC | PRN
  Administered 2020-10-14 (×2): 80 ug via INTRAVENOUS

## 2020-10-14 MED ORDER — LIDOCAINE 2% (20 MG/ML) 5 ML SYRINGE
INTRAMUSCULAR | Status: DC | PRN
  Administered 2020-10-14: 20 mg via INTRAVENOUS

## 2020-10-14 MED ORDER — HEPARIN (PORCINE) IN NACL 1000-0.9 UT/500ML-% IV SOLN
INTRAVENOUS | Status: AC
  Filled 2020-10-14: qty 1000

## 2020-10-14 MED ORDER — FENTANYL CITRATE (PF) 100 MCG/2ML IJ SOLN
50.0000 ug | Freq: Once | INTRAMUSCULAR | Status: AC
  Administered 2020-10-14: 50 ug via INTRAVENOUS

## 2020-10-14 MED ORDER — DOBUTAMINE IN D5W 4-5 MG/ML-% IV SOLN
INTRAVENOUS | Status: AC
  Filled 2020-10-14: qty 250

## 2020-10-14 MED ORDER — ACETAMINOPHEN 325 MG PO TABS
650.0000 mg | ORAL_TABLET | ORAL | Status: DC | PRN
  Filled 2020-10-14: qty 2

## 2020-10-14 MED ORDER — HEPARIN (PORCINE) IN NACL 1000-0.9 UT/500ML-% IV SOLN
INTRAVENOUS | Status: DC | PRN
  Administered 2020-10-14 (×5): 500 mL

## 2020-10-14 MED ORDER — SODIUM CHLORIDE 0.9% FLUSH: 3.0000 mL | INTRAVENOUS | Status: DC | PRN

## 2020-10-14 MED ORDER — OXYCODONE-ACETAMINOPHEN 5-325 MG PO TABS
1.0000 | ORAL_TABLET | Freq: Every day | ORAL | Status: DC
  Filled 2020-10-14 (×2): qty 1

## 2020-10-14 MED ORDER — FENTANYL CITRATE (PF) 100 MCG/2ML IJ SOLN
INTRAMUSCULAR | Status: DC | PRN
  Administered 2020-10-14 (×2): 50 ug via INTRAVENOUS
  Administered 2020-10-14: 100 ug via INTRAVENOUS

## 2020-10-14 MED ORDER — OXYCODONE-ACETAMINOPHEN 5-325 MG PO TABS
1.0000 | ORAL_TABLET | Freq: Every day | ORAL | Status: DC
  Administered 2020-10-14: 1 via ORAL

## 2020-10-14 MED ORDER — SUGAMMADEX SODIUM 200 MG/2ML IV SOLN
INTRAVENOUS | Status: DC | PRN
  Administered 2020-10-14: 200 mg via INTRAVENOUS

## 2020-10-14 MED ORDER — FENTANYL CITRATE (PF) 100 MCG/2ML IJ SOLN
INTRAMUSCULAR | Status: AC
  Filled 2020-10-14: qty 2

## 2020-10-14 MED ORDER — SODIUM CHLORIDE 0.9 % IV SOLN: 250.0000 mL | INTRAVENOUS | Status: DC | PRN

## 2020-10-14 MED ORDER — ONDANSETRON HCL 4 MG/2ML IJ SOLN
INTRAMUSCULAR | Status: DC | PRN
  Administered 2020-10-14: 4 mg via INTRAVENOUS

## 2020-10-14 MED ORDER — PHENYLEPHRINE HCL-NACL 10-0.9 MG/250ML-% IV SOLN
INTRAVENOUS | Status: DC | PRN
  Administered 2020-10-14: 15 ug/min via INTRAVENOUS

## 2020-10-14 MED ORDER — ONDANSETRON HCL 4 MG/2ML IJ SOLN: 4.0000 mg | Freq: Four times a day (QID) | INTRAMUSCULAR | Status: DC | PRN

## 2020-10-14 MED ORDER — SODIUM CHLORIDE 0.9% FLUSH: 3.0000 mL | Freq: Two times a day (BID) | INTRAVENOUS | Status: DC

## 2020-10-14 MED ORDER — HEPARIN SODIUM (PORCINE) 1000 UNIT/ML IJ SOLN
INTRAMUSCULAR | Status: AC
  Filled 2020-10-14: qty 1

## 2020-10-14 MED ORDER — ROCURONIUM BROMIDE 10 MG/ML (PF) SYRINGE
PREFILLED_SYRINGE | INTRAVENOUS | Status: DC | PRN
  Administered 2020-10-14: 50 mg via INTRAVENOUS
  Administered 2020-10-14: 70 mg via INTRAVENOUS

## 2020-10-14 SURGICAL SUPPLY — 23 items
BAG SNAP BAND KOVER 36X36 (MISCELLANEOUS) ×2 IMPLANT
BLANKET WARM UNDERBOD FULL ACC (MISCELLANEOUS) ×2 IMPLANT
CATH MAPPNG PENTARAY F 2-6-2MM (CATHETERS) ×1 IMPLANT
CATH S CIRCA THERM PROBE 10F (CATHETERS) ×2 IMPLANT
CATH SMTCH THERMOCOOL SF DF (CATHETERS) ×2 IMPLANT
CATH SOUNDSTAR ECO 8FR (CATHETERS) ×2 IMPLANT
CATH WEBSTER BI DIR CS D-F CRV (CATHETERS) ×2 IMPLANT
CLOSURE PERCLOSE PROSTYLE (VASCULAR PRODUCTS) ×8 IMPLANT
COVER SWIFTLINK CONNECTOR (BAG) ×2 IMPLANT
KIT VERSACROSS STEERABLE D1 (CATHETERS) ×2 IMPLANT
PACK EP LATEX FREE (CUSTOM PROCEDURE TRAY) ×2
PACK EP LF (CUSTOM PROCEDURE TRAY) ×1 IMPLANT
PAD PRO RADIOLUCENT 2001M-C (PAD) ×2 IMPLANT
PATCH CARTO3 (PAD) ×2 IMPLANT
PENTARAY F 2-6-2MM (CATHETERS) ×2
SHEATH BAYLIS SUREFLEX  M 8.5 (SHEATH) ×2
SHEATH BAYLIS SUREFLEX M 8.5 (SHEATH) ×1 IMPLANT
SHEATH CARTO VIZIGO SM CVD (SHEATH) ×2 IMPLANT
SHEATH PINNACLE 7F 10CM (SHEATH) ×2 IMPLANT
SHEATH PINNACLE 8F 10CM (SHEATH) ×4 IMPLANT
SHEATH PINNACLE 9F 10CM (SHEATH) ×2 IMPLANT
SHEATH PROBE COVER 6X72 (BAG) ×2 IMPLANT
TUBING SMART ABLATE COOLFLOW (TUBING) ×2 IMPLANT

## 2020-10-14 NOTE — Anesthesia Postprocedure Evaluation (Signed)
Anesthesia Post Note  Patient: Raymond Powell  Procedure(s) Performed: ATRIAL FIBRILLATION ABLATION (N/A )     Patient location during evaluation: Phase II Anesthesia Type: General Level of consciousness: awake and alert, patient cooperative and oriented Pain management: pain level controlled (pt states no pain) Vital Signs Assessment: post-procedure vital signs reviewed and stable Respiratory status: nonlabored ventilation, spontaneous breathing and respiratory function stable Cardiovascular status: stable and blood pressure returned to baseline Postop Assessment: no apparent nausea or vomiting Anesthetic complications: no   No complications documented.  Last Vitals:  Vitals:   10/14/20 1515 10/14/20 1520  BP: 135/76 (!) 141/77  Pulse: 76 75  Resp:  13  Temp: (!) 36.3 C   SpO2:  90%    Last Pain:  Vitals:   10/14/20 1515  TempSrc: Temporal  PainSc: 10-Worst pain ever                 Tiajah Oyster,E. Whitley Strycharz

## 2020-10-14 NOTE — Transfer of Care (Signed)
Immediate Anesthesia Transfer of Care Note  Patient: Raymond Powell  Procedure(s) Performed: ATRIAL FIBRILLATION ABLATION (N/A )  Patient Location: PACU and Cath Lab  Anesthesia Type:General  Level of Consciousness: awake and patient cooperative  Airway & Oxygen Therapy: Patient Spontanous Breathing and Patient connected to nasal cannula oxygen  Post-op Assessment: Report given to RN and Post -op Vital signs reviewed and stable  Post vital signs: Reviewed and stable  Last Vitals:  Vitals Value Taken Time  BP 122/73 10/14/20 1429  Temp 36.3 C 10/14/20 1427  Pulse 77 10/14/20 1432  Resp 13 10/14/20 1432  SpO2 93 % 10/14/20 1432  Vitals shown include unvalidated device data.  Last Pain:  Vitals:   10/14/20 1427  TempSrc: Temporal  PainSc: 0-No pain      Patients Stated Pain Goal: 10 (16/10/96 0454)  Complications: No complications documented.

## 2020-10-14 NOTE — Progress Notes (Signed)
Patient was given discharge instructions. He verbalized understanding. 

## 2020-10-14 NOTE — Anesthesia Procedure Notes (Signed)
Procedure Name: Intubation Date/Time: 10/14/2020 11:02 AM Performed by: Lowella Dell, CRNA Pre-anesthesia Checklist: Patient identified, Emergency Drugs available, Suction available and Patient being monitored Patient Re-evaluated:Patient Re-evaluated prior to induction Oxygen Delivery Method: Circle system utilized Preoxygenation: Pre-oxygenation with 100% oxygen Induction Type: IV induction Ventilation: Two handed mask ventilation required and Oral airway inserted - appropriate to patient size Laryngoscope Size: Mac and 4 Tube type: Oral Tube size: 7.5 mm Number of attempts: 1 Airway Equipment and Method: Stylet Placement Confirmation: ETT inserted through vocal cords under direct vision,  positive ETCO2 and breath sounds checked- equal and bilateral Secured at: 24 cm Tube secured with: Tape Dental Injury: Teeth and Oropharynx as per pre-operative assessment

## 2020-10-14 NOTE — Discharge Instructions (Signed)
Post procedure care instructions No driving for 4 days. No lifting over 5 lbs for 1 week. No vigorous or sexual activity for 1 week. You may return to work/your usual activities on 10/21/2020. Keep procedure site clean & dry. If you notice increased pain, swelling, bleeding or pus, call/return!  You may shower after 24 hours, but no soaking in baths/hot tubs/pools for 1 week.    You have an appointment set up with the [facility].  Multiple studies have shown that being followed by a dedicated atrial fibrillation clinic in addition to the standard care you receive from your other physicians improves health. We believe that enrollment in the atrial fibrillation clinic will allow [place] to better care for you.   The phone number to the [facility] is [phone]. The clinic is staffed Monday through Friday from 8:30am to 5pm.  Parking Directions: The clinic is located in the Heart and Vascular Building connected to [facility]. 1)From [address] turn on to [address] and go to the 3rd entrance  (Heart and Vascular entrance) on the right. 2)Look to the right for Heart &Vascular Parking Garage. 3)A code for the entrance is required, for January is 1111.   4)Take the elevators to the 1st floor. Registration is in the room with the glass walls at the end of the hallway.  If you have any trouble parking or locating the clinic, please don't hesitate to call [phone].Cardiac Ablation, Care After  This sheet gives you information about how to care for yourself after your procedure. Your health care provider may also give you more specific instructions. If you have problems or questions, contact your health care provider. What can I expect after the procedure? After the procedure, it is common to have:  Bruising around your puncture site.  Tenderness around your puncture site.  Skipped heartbeats.  Tiredness (fatigue).  Follow these instructions at  home: Puncture site care   Follow instructions from your health care provider about how to take care of your puncture site. Make sure you: ? If present, leave stitches (sutures), skin glue, or adhesive strips in place. These skin closures may need to stay in place for up to 2 weeks. If adhesive strip edges start to loosen and curl up, you may trim the loose edges. Do not remove adhesive strips completely unless your health care provider tells you to do that. ? If a large square bandage is present, this may be removed 24 hours after surgery.   Check your puncture site every day for signs of infection. Check for: ? Redness, swelling, or pain. ? Fluid or blood. If your puncture site starts to bleed, lie down on your back, apply firm pressure to the area, and contact your health care provider. ? Warmth. ? Pus or a bad smell. Driving  Do not drive for at least 4 days after your procedure or however long your health care provider recommends. (Do not resume driving if you have previously been instructed not to drive for other health reasons.)  Do not drive or use heavy machinery while taking prescription pain medicine. Activity  Avoid activities that take a lot of effort for at least 7 days after your procedure.  Do not lift anything that is heavier than 5 lb (4.5 kg) for one week.   No sexual activity for 1 week.   Return to your normal activities as told by your health care provider. Ask your health care provider what activities are safe for  you. General instructions  Take over-the-counter and prescription medicines only as told by your health care provider.  Do not use any products that contain nicotine or tobacco, such as cigarettes and e-cigarettes. If you need help quitting, ask your health care provider.  You may shower after 24 hours, but Do not take baths, swim, or use a hot tub for 1 week.   Do not drink alcohol for 24 hours after your procedure.  Keep all follow-up visits as  told by your health care provider. This is important. Contact a health care provider if:  You have redness, mild swelling, or pain around your puncture site.  You have fluid or blood coming from your puncture site that stops after applying firm pressure to the area.  Your puncture site feels warm to the touch.  You have pus or a bad smell coming from your puncture site.  You have a fever.  You have chest pain or discomfort that spreads to your neck, jaw, or arm.  You are sweating a lot.  You feel nauseous.  You have a fast or irregular heartbeat.  You have shortness of breath.  You are dizzy or light-headed and feel the need to lie down.  You have pain or numbness in the arm or leg closest to your puncture site. Get help right away if:  Your puncture site suddenly swells.  Your puncture site is bleeding and the bleeding does not stop after applying firm pressure to the area. These symptoms may represent a serious problem that is an emergency. Do not wait to see if the symptoms will go away. Get medical help right away. Call your local emergency services (911 in the [place] Do not drive yourself to the hospital. Summary  After the procedure, it is normal to have bruising and tenderness at the puncture site in your groin, neck, or forearm.  Check your puncture site every day for signs of infection.  Get help right away if your puncture site is bleeding and the bleeding does not stop after applying firm pressure to the area. This is a medical emergency. This information is not intended to replace advice given to you by your health care provider. Make sure you discuss any questions you have with your health care provider.

## 2020-10-14 NOTE — Anesthesia Preprocedure Evaluation (Addendum)
Anesthesia Evaluation  Patient identified by MRN, date of birth, ID band Patient awake    Reviewed: Allergy & Precautions, NPO status , Patient's Chart, lab work & pertinent test results, reviewed documented beta blocker date and time   History of Anesthesia Complications Negative for: history of anesthetic complications  Airway Mallampati: II  TM Distance: >3 FB Neck ROM: Full    Dental  (+) Dental Advisory Given   Pulmonary former smoker,  10/12/2020 SARS coronavirus NEG   breath sounds clear to auscultation       Cardiovascular hypertension, Pt. on medications and Pt. on home beta blockers (-) angina+ dysrhythmias Atrial Fibrillation  Rhythm:Irregular Rate:Normal  12/2019 ECHO: EF 60-65%, grade 1 DD, no significant valvular abnormalities   Neuro/Psych  Headaches,    GI/Hepatic Neg liver ROS, GERD  Controlled and Medicated,  Endo/Other  diabetes (glu 125)Morbid obesity  Renal/GU negative Renal ROS     Musculoskeletal   Abdominal (+) + obese,   Peds  Hematology eliquis   Anesthesia Other Findings   Reproductive/Obstetrics                            Anesthesia Physical Anesthesia Plan  ASA: III  Anesthesia Plan: General   Post-op Pain Management:    Induction: Intravenous  PONV Risk Score and Plan: 2 and Ondansetron, Dexamethasone and Treatment may vary due to age or medical condition  Airway Management Planned: Oral ETT  Additional Equipment: None  Intra-op Plan:   Post-operative Plan: Extubation in OR  Informed Consent: I have reviewed the patients History and Physical, chart, labs and discussed the procedure including the risks, benefits and alternatives for the proposed anesthesia with the patient or authorized representative who has indicated his/her understanding and acceptance.     Dental advisory given  Plan Discussed with: CRNA and Surgeon  Anesthesia Plan  Comments:        Anesthesia Quick Evaluation
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
## 2020-10-17 ENCOUNTER — Encounter (HOSPITAL_COMMUNITY): Payer: Self-pay | Admitting: Cardiology

## 2020-10-18 LAB — POCT ACTIVATED CLOTTING TIME: Activated Clotting Time: 338 seconds

## 2020-10-24 DIAGNOSIS — I4891 Unspecified atrial fibrillation: Secondary | ICD-10-CM | POA: Diagnosis not present

## 2020-10-24 DIAGNOSIS — E7800 Pure hypercholesterolemia, unspecified: Secondary | ICD-10-CM | POA: Diagnosis not present

## 2020-10-24 DIAGNOSIS — E119 Type 2 diabetes mellitus without complications: Secondary | ICD-10-CM | POA: Diagnosis not present

## 2020-11-09 DIAGNOSIS — G894 Chronic pain syndrome: Secondary | ICD-10-CM | POA: Diagnosis not present

## 2020-11-09 DIAGNOSIS — M47816 Spondylosis without myelopathy or radiculopathy, lumbar region: Secondary | ICD-10-CM | POA: Diagnosis not present

## 2020-11-09 DIAGNOSIS — G43909 Migraine, unspecified, not intractable, without status migrainosus: Secondary | ICD-10-CM | POA: Diagnosis not present

## 2020-11-10 DIAGNOSIS — E7800 Pure hypercholesterolemia, unspecified: Secondary | ICD-10-CM | POA: Diagnosis not present

## 2020-11-10 DIAGNOSIS — K219 Gastro-esophageal reflux disease without esophagitis: Secondary | ICD-10-CM | POA: Diagnosis not present

## 2020-11-10 DIAGNOSIS — I10 Essential (primary) hypertension: Secondary | ICD-10-CM | POA: Diagnosis not present

## 2020-11-14 ENCOUNTER — Ambulatory Visit (HOSPITAL_COMMUNITY)
Admission: RE | Admit: 2020-11-14 | Discharge: 2020-11-14 | Disposition: A | Discharge status: Home / Self Care | Payer: Medicare Other | Source: Ambulatory Visit | Attending: Physician Assistant | Admitting: Physician Assistant

## 2020-11-14 ENCOUNTER — Other Ambulatory Visit: Payer: Self-pay

## 2020-11-14 ENCOUNTER — Ambulatory Visit (HOSPITAL_COMMUNITY): Payer: Medicare Other | Admitting: Physician Assistant

## 2020-11-14 ENCOUNTER — Encounter (HOSPITAL_COMMUNITY): Payer: Self-pay | Admitting: Physician Assistant

## 2020-11-14 VITALS — BP 126/82 | HR 79 | Ht 73.0 in | Wt 290.8 lb

## 2020-11-14 DIAGNOSIS — Z823 Family history of stroke: Secondary | ICD-10-CM | POA: Diagnosis not present

## 2020-11-14 DIAGNOSIS — E669 Obesity, unspecified: Secondary | ICD-10-CM | POA: Insufficient documentation

## 2020-11-14 DIAGNOSIS — Z713 Dietary counseling and surveillance: Secondary | ICD-10-CM | POA: Insufficient documentation

## 2020-11-14 DIAGNOSIS — Z791 Long term (current) use of non-steroidal anti-inflammatories (NSAID): Secondary | ICD-10-CM | POA: Insufficient documentation

## 2020-11-14 DIAGNOSIS — I10 Essential (primary) hypertension: Secondary | ICD-10-CM | POA: Diagnosis not present

## 2020-11-14 DIAGNOSIS — E119 Type 2 diabetes mellitus without complications: Secondary | ICD-10-CM | POA: Diagnosis not present

## 2020-11-14 DIAGNOSIS — Z8249 Family history of ischemic heart disease and other diseases of the circulatory system: Secondary | ICD-10-CM | POA: Diagnosis not present

## 2020-11-14 DIAGNOSIS — R0683 Snoring: Secondary | ICD-10-CM | POA: Diagnosis not present

## 2020-11-14 DIAGNOSIS — Z7182 Exercise counseling: Secondary | ICD-10-CM | POA: Diagnosis not present

## 2020-11-14 DIAGNOSIS — E785 Hyperlipidemia, unspecified: Secondary | ICD-10-CM | POA: Diagnosis not present

## 2020-11-14 DIAGNOSIS — Z7901 Long term (current) use of anticoagulants: Secondary | ICD-10-CM | POA: Diagnosis not present

## 2020-11-14 DIAGNOSIS — Z79899 Other long term (current) drug therapy: Secondary | ICD-10-CM | POA: Diagnosis not present

## 2020-11-14 DIAGNOSIS — Z87891 Personal history of nicotine dependence: Secondary | ICD-10-CM | POA: Diagnosis not present

## 2020-11-14 DIAGNOSIS — I480 Paroxysmal atrial fibrillation: Secondary | ICD-10-CM | POA: Diagnosis not present

## 2020-11-14 DIAGNOSIS — D6869 Other thrombophilia: Secondary | ICD-10-CM | POA: Diagnosis not present

## 2020-11-14 DIAGNOSIS — Z6838 Body mass index (BMI) 38.0-38.9, adult: Secondary | ICD-10-CM | POA: Diagnosis not present
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
## 2020-12-17 DIAGNOSIS — D6869 Other thrombophilia: Secondary | ICD-10-CM | POA: Diagnosis not present

## 2020-12-17 DIAGNOSIS — I4891 Unspecified atrial fibrillation: Secondary | ICD-10-CM | POA: Diagnosis not present

## 2020-12-17 DIAGNOSIS — Z79891 Long term (current) use of opiate analgesic: Secondary | ICD-10-CM | POA: Diagnosis not present

## 2020-12-17 DIAGNOSIS — M5412 Radiculopathy, cervical region: Secondary | ICD-10-CM | POA: Diagnosis not present

## 2020-12-17 DIAGNOSIS — M961 Postlaminectomy syndrome, not elsewhere classified: Secondary | ICD-10-CM | POA: Diagnosis not present

## 2021-01-02 DIAGNOSIS — Z79899 Other long term (current) drug therapy: Secondary | ICD-10-CM | POA: Diagnosis not present

## 2021-01-02 DIAGNOSIS — M5412 Radiculopathy, cervical region: Secondary | ICD-10-CM | POA: Diagnosis not present
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
## 2021-01-16 ENCOUNTER — Encounter: Payer: Self-pay | Admitting: Cardiology

## 2021-01-16 ENCOUNTER — Ambulatory Visit: Payer: Medicare Other | Admitting: Cardiology

## 2021-01-16 ENCOUNTER — Other Ambulatory Visit: Payer: Self-pay

## 2021-01-16 VITALS — BP 118/90 | HR 81 | Ht 73.0 in | Wt 293.0 lb

## 2021-01-16 DIAGNOSIS — I480 Paroxysmal atrial fibrillation: Secondary | ICD-10-CM | POA: Diagnosis not present

## 2021-01-16 NOTE — Patient Instructions (Signed)
Medication Instructions:  Your physician has recommended you make the following change in your medication:  1. STOP Flecainide  *If you need a refill on your cardiac medications before your next appointment, please call your pharmacy*   Lab Work: None ordered   Testing/Procedures: None ordered   Follow-Up: At [facility], you and your health needs are our priority.  As part of our continuing mission to provide you with exceptional heart care, we have created designated Provider Care Teams.  These Care Teams include your primary Cardiologist (physician) and Advanced Practice Providers (APPs -  Physician Assistants and Nurse Practitioners) who all work together to provide you with the care you need, when you need it.  Your next appointment:   3 month(s)  The format for your next appointment:   In Person  Provider:   Allegra Lai, MD    Thank you for choosing [facility]!!   Trinidad Curet, RN [phone]   Other Instructions

## 2021-01-18 ENCOUNTER — Other Ambulatory Visit: Payer: Self-pay | Admitting: Cardiology

## 2021-01-18 DIAGNOSIS — Z79899 Other long term (current) drug therapy: Secondary | ICD-10-CM

## 2021-01-18 DIAGNOSIS — I480 Paroxysmal atrial fibrillation: Secondary | ICD-10-CM

## 2021-01-18 DIAGNOSIS — I484 Atypical atrial flutter: Secondary | ICD-10-CM

## 2021-01-18 DIAGNOSIS — Z7901 Long term (current) use of anticoagulants: Secondary | ICD-10-CM

## 2021-01-18 NOTE — Telephone Encounter (Signed)
Atenolol instruction corrected as instruction stated on 07/08/20 25mg  qd. Rx approved and sent

## 2021-01-30 DIAGNOSIS — E119 Type 2 diabetes mellitus without complications: Secondary | ICD-10-CM | POA: Diagnosis not present

## 2021-01-30 DIAGNOSIS — Z23 Encounter for immunization: Secondary | ICD-10-CM | POA: Diagnosis not present

## 2021-01-30 DIAGNOSIS — S61411A Laceration without foreign body of right hand, initial encounter: Secondary | ICD-10-CM | POA: Diagnosis not present

## 2021-02-02 ENCOUNTER — Other Ambulatory Visit: Payer: Self-pay | Admitting: Cardiology

## 2021-02-02 DIAGNOSIS — M5412 Radiculopathy, cervical region: Secondary | ICD-10-CM | POA: Diagnosis not present

## 2021-02-02 DIAGNOSIS — M961 Postlaminectomy syndrome, not elsewhere classified: Secondary | ICD-10-CM | POA: Diagnosis not present

## 2021-02-02 DIAGNOSIS — Z79891 Long term (current) use of opiate analgesic: Secondary | ICD-10-CM | POA: Diagnosis not present

## 2021-02-02 DIAGNOSIS — R002 Palpitations: Secondary | ICD-10-CM

## 2021-02-02 DIAGNOSIS — D6869 Other thrombophilia: Secondary | ICD-10-CM | POA: Diagnosis not present

## 2021-02-02 DIAGNOSIS — I480 Paroxysmal atrial fibrillation: Secondary | ICD-10-CM

## 2021-02-03 DIAGNOSIS — S61411A Laceration without foreign body of right hand, initial encounter: Secondary | ICD-10-CM | POA: Diagnosis not present

## 2021-02-03 NOTE — Telephone Encounter (Signed)
Flecainide approved and sent using Dr. Bettina Gavia new instruction on OV 07/08/2020(25mg  in the morning and 50 mg in the evening.

## 2021-02-08 DIAGNOSIS — E7800 Pure hypercholesterolemia, unspecified: Secondary | ICD-10-CM | POA: Diagnosis not present

## 2021-02-08 DIAGNOSIS — I10 Essential (primary) hypertension: Secondary | ICD-10-CM | POA: Diagnosis not present

## 2021-02-08 DIAGNOSIS — K219 Gastro-esophageal reflux disease without esophagitis: Secondary | ICD-10-CM | POA: Diagnosis not present

## 2021-02-13 DIAGNOSIS — E119 Type 2 diabetes mellitus without complications: Secondary | ICD-10-CM | POA: Diagnosis not present

## 2021-02-13 DIAGNOSIS — S61411A Laceration without foreign body of right hand, initial encounter: Secondary | ICD-10-CM | POA: Diagnosis not present

## 2021-02-15 ENCOUNTER — Other Ambulatory Visit: Payer: Self-pay | Admitting: Cardiology

## 2021-02-15 DIAGNOSIS — I480 Paroxysmal atrial fibrillation: Secondary | ICD-10-CM

## 2021-02-15 DIAGNOSIS — R002 Palpitations: Secondary | ICD-10-CM

## 2021-04-10 DIAGNOSIS — K219 Gastro-esophageal reflux disease without esophagitis: Secondary | ICD-10-CM | POA: Diagnosis not present

## 2021-04-10 DIAGNOSIS — E7800 Pure hypercholesterolemia, unspecified: Secondary | ICD-10-CM | POA: Diagnosis not present

## 2021-04-10 DIAGNOSIS — I10 Essential (primary) hypertension: Secondary | ICD-10-CM | POA: Diagnosis not present

## 2021-04-20 ENCOUNTER — Telehealth: Payer: Self-pay | Admitting: Cardiology

## 2021-04-20 NOTE — Telephone Encounter (Signed)
Pt calling to report an incidence of AF last night lasting 3-4 hours with HR between 154-156. He states his HR did decrease in the last hour before converting back into NSR. He denies syncope, CP, SOB during these episodes, however does feel fatigued. He states he has been having periods of AF 1-2 times per week for the last couple of months. Currently he is not taking anything for his HR. His flecainide was d/c during his last visit and he takes atenolol for migraine HA's.  I advised him I would forward to Dr. Curt Bears to review and his nurse to follow up later this week. It was recommended he visit the ED for sustained high HR's especially if he was symptomatic.

## 2021-04-20 NOTE — Telephone Encounter (Signed)
Patient c/o Palpitations:  High priority if patient c/o lightheadedness, shortness of breath, or chest pain  How long have you had palpitations/irregular HR/ Afib? Are you having the symptoms now? No. Symptoms only lasted a few hours last night   Are you currently experiencing lightheadedness, SOB or CP? no  Do you have a history of afib (atrial fibrillation) or irregular heart rhythm? yes  Have you checked your BP or HR? (document readings if available): no  Are you experiencing any other symptoms? Fatigued, uneasy feeling   Patient was told to reach out to [facility] whenever he went back into Afib. The symptoms only lasted for a few hours lat night

## 2021-04-21 NOTE — Telephone Encounter (Signed)
Followed up with pt. Pt will restart Flecainide at 25 mg in the morning and 50 mg in the evening.  (Pt took lower dose in the am b/c he was having low HRs) Pt confirms he is taking Atenolol daily. He does not need Rx for either medication sent in as he has enough on hand at home. He will keep scheduled follow up on 7/11 in [place] w/ Dr. Curt Bears. Advised to call office before then if above recommendations did not make an improvement in symptoms. Patient verbalized understanding and agreeable to plan.

## 2021-04-24 DIAGNOSIS — Z79899 Other long term (current) drug therapy: Secondary | ICD-10-CM | POA: Diagnosis not present

## 2021-04-24 DIAGNOSIS — K2100 Gastro-esophageal reflux disease with esophagitis, without bleeding: Secondary | ICD-10-CM | POA: Diagnosis not present

## 2021-04-24 DIAGNOSIS — E1169 Type 2 diabetes mellitus with other specified complication: Secondary | ICD-10-CM | POA: Diagnosis not present

## 2021-04-24 DIAGNOSIS — E785 Hyperlipidemia, unspecified: Secondary | ICD-10-CM | POA: Diagnosis not present

## 2021-04-24 DIAGNOSIS — Z0000 Encounter for general adult medical examination without abnormal findings: Secondary | ICD-10-CM | POA: Diagnosis not present

## 2021-04-24 DIAGNOSIS — Z9181 History of falling: Secondary | ICD-10-CM | POA: Diagnosis not present

## 2021-05-04 DIAGNOSIS — G894 Chronic pain syndrome: Secondary | ICD-10-CM | POA: Diagnosis not present

## 2021-05-19 DIAGNOSIS — M5136 Other intervertebral disc degeneration, lumbar region: Secondary | ICD-10-CM | POA: Insufficient documentation

## 2021-05-22 ENCOUNTER — Ambulatory Visit: Payer: Medicare Other | Admitting: Cardiology

## 2021-05-22 ENCOUNTER — Encounter: Payer: Self-pay | Admitting: Cardiology

## 2021-05-22 ENCOUNTER — Other Ambulatory Visit: Payer: Self-pay

## 2021-05-22 VITALS — BP 124/80 | HR 85 | Ht 73.0 in | Wt 298.8 lb

## 2021-05-22 DIAGNOSIS — I480 Paroxysmal atrial fibrillation: Secondary | ICD-10-CM | POA: Diagnosis not present

## 2021-05-22 NOTE — Patient Instructions (Addendum)
Medication Instructions:  Your physician recommends that you continue on your current medications as directed. Please refer to the Current Medication list given to you today.  *If you need a refill on your cardiac medications before your next appointment, please call your pharmacy*   Lab Work: None ordered If you have labs (blood work) drawn today and your tests are completely normal, you will receive your results only by: [place] (if you have MyChart) OR A paper copy in the mail If you have any lab test that is abnormal or we need to change your treatment, we will call you to review the results.   Testing/Procedures: Your physician has recommended that you have a home sleep study. This test records several body functions during sleep, including: brain activity, eye movement, oxygen and carbon dioxide blood levels, heart rate and rhythm, breathing rate and rhythm, the flow of air through your mouth and nose, snoring, body muscle movements, and chest and belly movement.   Follow-Up: At [facility], you and your health needs are our priority.  As part of our continuing mission to provide you with exceptional heart care, we have created designated Provider Care Teams.  These Care Teams include your primary Cardiologist (physician) and Advanced Practice Providers (APPs -  Physician Assistants and Nurse Practitioners) who all work together to provide you with the care you need, when you need it.  We recommend signing up for the patient portal called "MyChart".  Sign up information is provided on this After Visit Summary.  MyChart is used to connect with patients for Virtual Visits (Telemedicine).  Patients are able to view lab/test results, encounter notes, upcoming appointments, etc.  Non-urgent messages can be sent to your provider as well.   To learn more about what you can do with MyChart, go to [email or web address].    Your next appointment:   6 month(s)  The format for your  next appointment:   In Person  Provider:   Allegra Lai, MD    Thank you for choosing [facility]!!   Trinidad Curet, RN [phone]
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
## 2021-07-03 ENCOUNTER — Other Ambulatory Visit: Payer: Self-pay | Admitting: Cardiology

## 2021-07-03 DIAGNOSIS — I480 Paroxysmal atrial fibrillation: Secondary | ICD-10-CM

## 2021-07-03 DIAGNOSIS — R002 Palpitations: Secondary | ICD-10-CM

## 2021-07-03 NOTE — Telephone Encounter (Signed)
Prescription refill request for Eliquis received. Indication:afib Last office visit:[place] 05/22/21 Scr:0.86 09/20/20 Age: [age]kg Weight:135.5kg

## 2021-08-07 DIAGNOSIS — M533 Sacrococcygeal disorders, not elsewhere classified: Secondary | ICD-10-CM | POA: Diagnosis not present

## 2021-08-07 DIAGNOSIS — Z79899 Other long term (current) drug therapy: Secondary | ICD-10-CM | POA: Diagnosis not present

## 2021-08-07 DIAGNOSIS — Z5181 Encounter for therapeutic drug level monitoring: Secondary | ICD-10-CM | POA: Diagnosis not present

## 2021-08-08 DIAGNOSIS — H68012 Acute Eustachian salpingitis, left ear: Secondary | ICD-10-CM | POA: Diagnosis not present

## 2021-08-09 DIAGNOSIS — M533 Sacrococcygeal disorders, not elsewhere classified: Secondary | ICD-10-CM | POA: Insufficient documentation

## 2021-08-30 DIAGNOSIS — M533 Sacrococcygeal disorders, not elsewhere classified: Secondary | ICD-10-CM | POA: Diagnosis not present

## 2021-09-11 DIAGNOSIS — H2513 Age-related nuclear cataract, bilateral: Secondary | ICD-10-CM | POA: Diagnosis not present

## 2021-09-11 DIAGNOSIS — E119 Type 2 diabetes mellitus without complications: Secondary | ICD-10-CM | POA: Diagnosis not present

## 2021-09-12 ENCOUNTER — Telehealth: Payer: Self-pay | Admitting: *Deleted

## 2021-09-12 NOTE — Telephone Encounter (Signed)
Prior Authorization for HST sent to [facility] via web portal.   Prior Authorization is not required for the requested services  Decision ID #:[id number]

## 2021-09-12 NOTE — Telephone Encounter (Signed)
-----   Message from Michae Kava, [place] sent at 09/12/2021  9:33 AM EDT ----- Regarding: [facility] Sleep Study Good morning ladies,   [facility] everyone is doing well post halloween, LOL !!! Can someone please check for authorization for this pt for [facility] Sleep Study. I appreciate any help.   Thank you  Arbie Cookey

## 2021-09-12 NOTE — Telephone Encounter (Signed)
RE: [facility] Sleep Study Received: Today Lauralee Evener, CMA  Michae Kava, CMA No PA is required per [facility] web portal.  I will give the PIN # [id number] to pt when he comes in tomorrow for sleep study set up

## 2021-09-12 NOTE — Telephone Encounter (Signed)
Stop bang in from ov 08/25/20 score is 6. Staff message was sent to Sleep pool, Milas Hock. CMA and Erasmo Score. CMA today to check for prior auth for sleep study.

## 2021-09-12 NOTE — Telephone Encounter (Signed)
Cherylann Parr CMA came to me today and stating that she was working on some of our work que for the office and came across pt was supposed to had been set up for a Itamar sleep study last 08/2020. Pt was seen by Dr. Curt Bears in our [place] office 08/25/20 and ordered a [facility] Sleep Study during ov. Order was placed by Dr. Curt Bears nurse Venida Jarvis, though no information was ever sent to sleep pool for authorization nor to staff to set up the sleep study.   I did tell Ronny Bacon that we do not set the Itamar sleep studies up here in the [facility] location for other office locations at this time. I advised the only thing I can do is see if the pt is willing to come here to [facility] for me to set him up and proceed. I at that time took ownership of getting the pt set up for [facility] Sleep Study.   I then called the pt and firt thing I did was apologize for [facility] dropping the ball on the [facility] Sleep Study. I did tell the pt that I could set him with the study, though I would need for him to come to the [facility] location, [place]. Pt is willing to come to [place] location tomorrow 09/13/21 @ 2 pm. I did tell the pt to ask for me Arbie Cookey) at the [employer]. Pt did ask for the office phone # incase he has to change the appt with me. Gave ph# [phone] and that he can let the operator know he is needing to cancel the appt with me and I will call him back to reschedule. Pt thanked me for the call and the help today.

## 2021-09-13 NOTE — Telephone Encounter (Signed)
Pt came in and has been given [facility] Sleep study, as well as the PIN # [id number]. Pt will proceed with study sometime this week or coming weekend. Pt is grateful for the help. Pt aware once study is completed and read by the cardiologist our office will call with results and any recommendations.

## 2021-09-13 NOTE — Telephone Encounter (Signed)
Freada Bergeron, CMA  You 14 hours ago (5:55 PM)   OK to schedule    Freada Bergeron, CMA 14 hours ago (5:55 PM)   Prior Authorization for HST sent to [facility] via web portal.    Prior Authorization is not required for the requested services   Decision ID #:[id number]

## 2021-09-18 ENCOUNTER — Telehealth: Payer: Self-pay | Admitting: Cardiology

## 2021-09-18 NOTE — Telephone Encounter (Signed)
Patient called and said he tried to do his home sleep study last night but the machine did not work. He is not sure what to do now. Please advise

## 2021-09-19 ENCOUNTER — Telehealth: Payer: Self-pay | Admitting: Cardiology

## 2021-09-19 DIAGNOSIS — Z79899 Other long term (current) drug therapy: Secondary | ICD-10-CM

## 2021-09-19 DIAGNOSIS — I484 Atypical atrial flutter: Secondary | ICD-10-CM

## 2021-09-19 DIAGNOSIS — Z7901 Long term (current) use of anticoagulants: Secondary | ICD-10-CM

## 2021-09-19 DIAGNOSIS — I480 Paroxysmal atrial fibrillation: Secondary | ICD-10-CM

## 2021-09-19 MED ORDER — ATENOLOL 50 MG PO TABS: 25.0000 mg | ORAL_TABLET | Freq: Every day | ORAL | 3 refills | Status: DC

## 2021-09-19 NOTE — Telephone Encounter (Signed)
Pt c/o medication issue:  1. Name of Medication: atenolol (TENORMIN) 50 MG tablet  2. How are you currently taking this medication (dosage and times per day)? Take 0.5 tablets (25 mg total) by mouth daily. 25mg  qd as instructions on 07/08/2020  3. Are you having a reaction (difficulty breathing--STAT)? no  4. What is your medication issue? pt would like to renew this prescription and would like to get 90 tablets for 90 ds for 50mg  tablets... please advise

## 2021-09-19 NOTE — Telephone Encounter (Signed)
Patient reports that his atenolol was reduced to 25 mg daily back in 06/2020 due to bradycardia. He states that after he began feeling better he went back on his normal dose of 50 mg daily. He states that he has been taking the 50 mg daily for a while now. He is requesting a new prescription for him to take 50 mg per day. Patient states that he has been feeling good with no complaints. Heart rate and blood pressure have been good.

## 2021-09-20 NOTE — Telephone Encounter (Signed)
I called and s/w pt's wife (DPR). I called back just checking to see if they were able to s/w the sleep study company about the issue the pt had trying to use. I offered to the pt's wife, why don't we just have them stop by the office and I will give them a new box, no charge as the one he has is not working and register another serial #. Pt's wife thanked me for the call again today and the help. I asked what day this week do they want to stop by the office to pick up new box. She said she will s/w the pt and call back.

## 2021-09-20 NOTE — Telephone Encounter (Signed)
Patient is following up. He states he will try to come by the office tomorrow, 11/10 around 2:30 PM to pick up new box. Please return call to confirm date and time.

## 2021-09-20 NOTE — Telephone Encounter (Signed)
I confirmed with the pt that tomorrow 2:30 will be fine to stop by the office and pick up a new Itamar sleep box. Once I give the pt a new box I will register the new serial # into [facility].

## 2021-09-20 NOTE — Telephone Encounter (Signed)
I called pt back and s/w his wife Raymond Powell, as pt was not available at the moment. Raymond Powell, explained to me that the pt tried to set up the Itamar study the other night. They had a problem when he put the battery in that the phone application for the sleep study kept telling him to check the battery. Pt's wife said pt even put in another battery and it still did not work. I confirmed the phone was on and bluetooth was on as well, pt wife answered yes. I did ask if they called the 800# that is on the paper that I gave them when they came in. Pt's wife said no that the pt was getting ready for bed and was now frustrated. I stated that is why we give the 800# to call in case of problems as we are not in the office during the night time. I did ask if they would call the 800# today, explain to the technician everything that the phone was telling them and what it was doing or not doing. I asked if they would call me back either today or this week and tell me what the company stated to them as well. I explained that they may run into this problem before and may know a way to fix it or work around the issue. I am not sure if we will have to swap out his sleep study box. I will wait to hear back from the pt after they have spoken with the technician with [employer] ([facility]).

## 2021-09-21 MED ORDER — ATENOLOL 50 MG PO TABS: 50.0000 mg | ORAL_TABLET | Freq: Every day | ORAL | 3 refills | Status: DC

## 2021-09-21 NOTE — Telephone Encounter (Signed)
Pt stopped by the office today and picked up another Itamar sleep study box. Not sure if the previous Itamar sleep study watch was defective, pt keeps getting check battery. Pt has even tried putting in one of his own batteries. We opted to try a new study watch. I actually put an extra battery in the box as well in the box I gave the pt today, which now has 2 batteries. I did advise the pt if he still has problems that he will need to let our office. Pt is agreeable to plan of care and thanked me for the help. I have registered the Itamar box given to the pt today: Serial # [id number].

## 2021-09-21 NOTE — Telephone Encounter (Signed)
Refill has been sent in.  

## 2021-09-22 ENCOUNTER — Encounter (INDEPENDENT_AMBULATORY_CARE_PROVIDER_SITE_OTHER): Payer: Medicare Other | Admitting: Cardiology

## 2021-09-22 DIAGNOSIS — G4733 Obstructive sleep apnea (adult) (pediatric): Secondary | ICD-10-CM

## 2021-09-22 MED ORDER — ATENOLOL 50 MG PO TABS
50.0000 mg | ORAL_TABLET | Freq: Every day | ORAL | 3 refills | Status: DC
Start: 2021-09-22 — End: 2022-01-22

## 2021-09-22 NOTE — Addendum Note (Signed)
Addended by: Aris Georgia, Talbert Trembath L on: 09/22/2021 01:39 PM   Modules accepted: Orders

## 2021-09-22 NOTE — Telephone Encounter (Signed)
Sent Atenolol to pharmacy of choice.

## 2021-09-22 NOTE — Telephone Encounter (Signed)
Patient would like this script to be sent to :[facility] ([facility]) - [place], [place]  It was sent to the wrong pharmacy.

## 2021-09-25 ENCOUNTER — Telehealth: Payer: Self-pay | Admitting: *Deleted

## 2021-09-25 ENCOUNTER — Ambulatory Visit: Payer: Medicare Other

## 2021-09-25 ENCOUNTER — Other Ambulatory Visit: Payer: Self-pay

## 2021-09-25 DIAGNOSIS — G4733 Obstructive sleep apnea (adult) (pediatric): Secondary | ICD-10-CM

## 2021-09-25 NOTE — Telephone Encounter (Signed)
-----   Message from Sueanne Margarita, MD sent at 09/25/2021 10:48 AM EST ----- Please let patient know that they have sleep apnea.  Recommend therapeutic CPAP titration for treatment of patient's sleep disordered breathing.  If unable to perform an in lab titration then initiate ResMed auto CPAP from 4 to 15cm H2O with heated humidity and mask of choice and overnight pulse ox on CPAP.

## 2021-09-25 NOTE — Procedures (Signed)
    Sleep Study Report  Patient Information Study Date: 09/22/21 Patient Name: Raymond Powell Patient ID: [id number] Birth Date: 03-Jul-2056 Age: 65 Gender: Male BMI: 39.4 (W=297 lb, H=6' 1'') Referring Physician: Reggy Eye, MD  TEST DESCRIPTION: Home sleep apnea testing was completed using the WatchPat, a Type 1 device, utilizing peripheral arterial tonometry (PAT), chest movement, actigraphy, pulse oximetry, pulse rate, body position and snore. AHI was calculated with apnea and hypopnea using valid sleep time as the denominator. RDI includes apneas, hypopneas, and RERAs. The data acquired and the scoring of sleep and all associated events were performed in accordance with the recommended standards and specifications as outlined in the AASM Manual for the Scoring of Sleep and Associated Events 2.2.0 (2015).  FINDINGS: 1. Severe Obstructive Sleep Apnea with AHI 53.7/hr. 2. Mild Central Sleep Apnea with pAHIc 9.8/hr with 11.7% Cheyne Stoke respirations. 3. Oxygen desaturations as low as 68%. 4. Severe snoring was present. O2 sats were < 88% for 69.4 min. 5. Total sleep time was 8 hrs and 1 min. 6. 15% of total sleep time was spent in REM sleep. 7. Normal sleep onset latency at 20 min. 8. Shortened REM sleep onset latency at 53 min. 9. Total awakenings were 14.  DIAGNOSIS: Severe Obstructive Sleep Apnea (G47.33)  RECOMMENDATIONS: 1. Clinical correlation of these findings is necessary. The decision to treat obstructive sleep apnea (OSA) is usually based on the presence of apnea symptoms or the presence of associated medical conditions such as Hypertension, Congestive Heart Failure, Atrial Fibrillation or Obesity. The most common symptoms of OSA are snoring, gasping for breath while sleeping, daytime sleepiness and fatigue.  2. Initiating apnea therapy is recommended given the presence of symptoms and/or associated conditions. Recommend proceeding with one of the  following:   a. Auto-CPAP therapy with a pressure range of 5-20cm H2O.   b. An oral appliance (OA) that can be obtained from certain dentists with expertise in sleep medicine. These are primarily of use in non-obese patients with mild and moderate disease.   c. An ENT consultation which may be useful to look for specific causes of obstruction and possible treatment options.   d. If patient is intolerant to PAP therapy, consider referral to ENT for evaluation for hypoglossal nerve stimulator.  3. Close follow-up is necessary to ensure success with CPAP or oral appliance therapy for maximum benefit .  4. A follow-up oximetry study on CPAP is recommended to assess the adequacy of therapy and determine the need for supplemental oxygen or the potential need for Bi-level therapy. An arterial blood gas to determine the adequacy of baseline ventilation and oxygenation should also be considered.  5. Healthy sleep recommendations include: adequate nightly sleep (normal 7-9 hrs/night), avoidance of caffeine after noon and alcohol near bedtime, and maintaining a sleep environment that is cool, dark and quiet.  6. Weight loss for overweight patients is recommended. Even modest amounts of weight loss can significantly improve the severity of sleep apnea.  7. Snoring recommendations include: weight loss where appropriate, side sleeping, and avoidance of alcohol before bed.  8. Operation of motor vehicle should be avoided when sleepy.  Signature: Electronically Signed: 09/25/21 Fransico Him, MD; [facility]; [place], American Board of Sleep Medicine

## 2021-09-29 NOTE — Telephone Encounter (Signed)
The patient has been notified of the result and verbalized understanding.  All questions (if any) were answered. Marolyn Hammock, [place] 09/29/2021 69:24 AM    To precert titr

## 2021-10-02 ENCOUNTER — Telehealth: Payer: Self-pay | Admitting: Cardiology

## 2021-10-02 NOTE — Telephone Encounter (Signed)
Called pt to inform him that his medication has already been sent to [facility] mail order pharmacy. I advised pt that if he has any other problems, questions or concerns, to please call our office. Pt verbalized understanding.

## 2021-10-02 NOTE — Telephone Encounter (Signed)
*  STAT* If patient is at the pharmacy, call can be transferred to refill team.   1. Which medications need to be refilled? (please list name of each medication and dose if known)  atenolol (TENORMIN) 50 MG tablet  2. Which pharmacy/location (including street and city if local pharmacy) is medication to be sent to? [facility] ([place], [place]  3. Do they need a 30 day or 90 day supply? 90 with refills   Patient states [facility] does not have an rx on file for him

## 2021-10-13 NOTE — Telephone Encounter (Signed)
Prior Authorization for titration sent to [facility] via web portal.  Notification or Prior Authorization is not required for the requested services  Decision ID #:[id number]

## 2021-11-09 NOTE — Telephone Encounter (Signed)
Patient is scheduled for CPAP Titration on 11-30-20. Patient understands his titration study will be done at [facility] sleep lab. Patient understands he will receive a letter in a week or so detailing appointment, date, time, and location.

## 2021-11-14 DIAGNOSIS — Z2821 Immunization not carried out because of patient refusal: Secondary | ICD-10-CM | POA: Diagnosis not present

## 2021-11-14 DIAGNOSIS — E1169 Type 2 diabetes mellitus with other specified complication: Secondary | ICD-10-CM | POA: Diagnosis not present

## 2021-11-14 DIAGNOSIS — E785 Hyperlipidemia, unspecified: Secondary | ICD-10-CM | POA: Diagnosis not present

## 2021-11-14 DIAGNOSIS — I480 Paroxysmal atrial fibrillation: Secondary | ICD-10-CM | POA: Diagnosis not present

## 2021-11-21 DIAGNOSIS — M5136 Other intervertebral disc degeneration, lumbar region: Secondary | ICD-10-CM | POA: Diagnosis not present

## 2021-11-21 DIAGNOSIS — M5416 Radiculopathy, lumbar region: Secondary | ICD-10-CM | POA: Diagnosis not present

## 2021-11-21 DIAGNOSIS — M961 Postlaminectomy syndrome, not elsewhere classified: Secondary | ICD-10-CM | POA: Diagnosis not present

## 2021-11-21 DIAGNOSIS — D6869 Other thrombophilia: Secondary | ICD-10-CM | POA: Diagnosis not present

## 2021-11-21 DIAGNOSIS — M5412 Radiculopathy, cervical region: Secondary | ICD-10-CM | POA: Diagnosis not present

## 2021-11-29 DIAGNOSIS — M5451 Vertebrogenic low back pain: Secondary | ICD-10-CM | POA: Diagnosis not present

## 2021-11-30 ENCOUNTER — Encounter (HOSPITAL_BASED_OUTPATIENT_CLINIC_OR_DEPARTMENT_OTHER): Payer: Medicare Other | Admitting: Cardiology

## 2021-12-04 ENCOUNTER — Telehealth: Payer: Self-pay | Admitting: Cardiology

## 2021-12-04 DIAGNOSIS — I480 Paroxysmal atrial fibrillation: Secondary | ICD-10-CM

## 2021-12-04 DIAGNOSIS — R002 Palpitations: Secondary | ICD-10-CM

## 2021-12-04 MED ORDER — FLECAINIDE ACETATE 50 MG PO TABS: ORAL_TABLET | ORAL | 1 refills | Status: DC

## 2021-12-04 NOTE — Telephone Encounter (Signed)
°*  STAT* If patient is at the pharmacy, call can be transferred to refill team.   1. Which medications need to be refilled? (please list name of each medication and dose if known) flecainide (TAMBOCOR) 50 MG tablet  2. Which pharmacy/location (including street and city if local pharmacy) is medication to be sent to? [facility] [place], [place]  3. Do they need a 30 day or 90 day supply? [place]

## 2021-12-04 NOTE — Telephone Encounter (Signed)
Refill sent.  Pt has follow up with WC in March 2023

## 2021-12-05 DIAGNOSIS — M5116 Intervertebral disc disorders with radiculopathy, lumbar region: Secondary | ICD-10-CM | POA: Diagnosis not present

## 2021-12-05 DIAGNOSIS — M961 Postlaminectomy syndrome, not elsewhere classified: Secondary | ICD-10-CM | POA: Diagnosis not present

## 2021-12-05 DIAGNOSIS — M5412 Radiculopathy, cervical region: Secondary | ICD-10-CM | POA: Diagnosis not present

## 2022-01-16 ENCOUNTER — Other Ambulatory Visit: Payer: Self-pay | Admitting: Cardiology

## 2022-01-16 DIAGNOSIS — R002 Palpitations: Secondary | ICD-10-CM

## 2022-01-16 DIAGNOSIS — I480 Paroxysmal atrial fibrillation: Secondary | ICD-10-CM

## 2022-01-16 NOTE — Telephone Encounter (Signed)
Pt last saw Dr Curt Bears 05/22/21, last labs 04/24/21 Creat 0.80 at [facility] per [place], age 66, weight 135.5kg, based on specified criteria pt is on appropriate dosage of Eliquis '5mg'$  BID for afib.  Will refill rx.  ?

## 2022-01-22 ENCOUNTER — Ambulatory Visit: Payer: Medicare Other | Admitting: Cardiology

## 2022-01-22 ENCOUNTER — Encounter: Payer: Self-pay | Admitting: Cardiology

## 2022-01-22 ENCOUNTER — Other Ambulatory Visit: Payer: Self-pay

## 2022-01-22 VITALS — BP 136/88 | HR 102 | Ht 73.0 in | Wt 302.0 lb

## 2022-01-22 DIAGNOSIS — I480 Paroxysmal atrial fibrillation: Secondary | ICD-10-CM

## 2022-01-22 DIAGNOSIS — D6869 Other thrombophilia: Secondary | ICD-10-CM

## 2022-01-22 DIAGNOSIS — R002 Palpitations: Secondary | ICD-10-CM | POA: Diagnosis not present

## 2022-01-22 MED ORDER — APIXABAN 5 MG PO TABS: 5.0000 mg | ORAL_TABLET | Freq: Two times a day (BID) | ORAL | 3 refills | Status: DC

## 2022-01-22 MED ORDER — ATENOLOL 50 MG PO TABS: 50.0000 mg | ORAL_TABLET | Freq: Every day | ORAL | 3 refills | Status: DC

## 2022-01-22 NOTE — Patient Instructions (Signed)
Medication Instructions:  ?INCREASE ATENOLOL TO 50 MG EVERY DAY  ?*If you need a refill on your cardiac medications before your next appointment, please call your pharmacy* ? ? ?Lab Work: ?NONE ?If you have labs (blood work) drawn today and your tests are completely normal, you will receive your results only by: ?MyChart Message (if you have MyChart) OR ?A paper copy in the mail ?If you have any lab test that is abnormal or we need to change your treatment, we will call you to review the results. ? ? ?Testing/Procedures: ?NONE ? ? ?Follow-Up: ?At [facility], you and your health needs are our priority.  As part of our continuing mission to provide you with exceptional heart care, we have created designated Provider Care Teams.  These Care Teams include your primary Cardiologist (physician) and Advanced Practice Providers (APPs -  Physician Assistants and Nurse Practitioners) who all work together to provide you with the care you need, when you need it. ? ?We recommend signing up for the patient portal called "MyChart".  Sign up information is provided on this After Visit Summary.  MyChart is used to connect with patients for Virtual Visits (Telemedicine).  Patients are able to view lab/test results, encounter notes, upcoming appointments, etc.  Non-urgent messages can be sent to your provider as well.   ?To learn more about what you can do with MyChart, go to [email or web address].   ? ?Your next appointment:   ?6 month(s) ? ?The format for your next appointment:   ?In Person ? ?Provider:   ?DR Curt Bears   ? ? ?Other Instructions ?NONE ?
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
## 2022-02-06 ENCOUNTER — Telehealth: Payer: Self-pay | Admitting: *Deleted

## 2022-02-06 NOTE — Telephone Encounter (Signed)
Reached out to the patient to inform him that he was scheduled for a cpap titration on 11/30/21 to get the setting for his cpap which he called and cancelled or it was cancelled via the automated system. Left a message to inform the patient to call the sleep lab at [phone] and reschedule himself to complete his plan of care. ?

## 2022-02-06 NOTE — Telephone Encounter (Signed)
-----   Message from Sueanne Margarita, MD sent at 01/22/2022  5:50 PM EDT ----- ?Gae Bon can you please help with this? ?----- Message ----- ?From: Constance Haw, MD ?Sent: 01/22/2022   2:31 PM EDT ?To: Sueanne Margarita, MD ? ?I saw Raymond Powell today.  It sounds like he is having trouble getting his CPAP.  I did know if there is anything from your standpoint with your resources that you knew of to speed things along.  Thanks. ? ?

## 2022-02-20 ENCOUNTER — Encounter (HOSPITAL_BASED_OUTPATIENT_CLINIC_OR_DEPARTMENT_OTHER): Payer: Medicare Other | Admitting: Cardiology

## 2022-02-26 ENCOUNTER — Encounter (INDEPENDENT_AMBULATORY_CARE_PROVIDER_SITE_OTHER): Payer: Self-pay

## 2022-02-26 ENCOUNTER — Encounter (INDEPENDENT_AMBULATORY_CARE_PROVIDER_SITE_OTHER): Payer: Self-pay | Admitting: Nurse Practitioner

## 2022-03-02 ENCOUNTER — Encounter: Payer: Self-pay | Admitting: Cardiology

## 2022-03-02 DIAGNOSIS — I480 Paroxysmal atrial fibrillation: Secondary | ICD-10-CM

## 2022-03-02 DIAGNOSIS — R002 Palpitations: Secondary | ICD-10-CM

## 2022-03-02 MED ORDER — FLECAINIDE ACETATE 50 MG PO TABS: ORAL_TABLET | ORAL | 1 refills | Status: DC

## 2022-03-03 DIAGNOSIS — M5416 Radiculopathy, lumbar region: Secondary | ICD-10-CM | POA: Diagnosis not present

## 2022-03-03 DIAGNOSIS — M5412 Radiculopathy, cervical region: Secondary | ICD-10-CM | POA: Diagnosis not present

## 2022-03-03 DIAGNOSIS — M961 Postlaminectomy syndrome, not elsewhere classified: Secondary | ICD-10-CM | POA: Diagnosis not present

## 2022-03-03 DIAGNOSIS — M5136 Other intervertebral disc degeneration, lumbar region: Secondary | ICD-10-CM | POA: Diagnosis not present

## 2022-03-16 ENCOUNTER — Ambulatory Visit (HOSPITAL_BASED_OUTPATIENT_CLINIC_OR_DEPARTMENT_OTHER): Payer: Medicare Other | Attending: Cardiology | Admitting: Cardiology

## 2022-03-16 DIAGNOSIS — G4731 Primary central sleep apnea: Secondary | ICD-10-CM | POA: Insufficient documentation

## 2022-03-16 DIAGNOSIS — R0902 Hypoxemia: Secondary | ICD-10-CM | POA: Diagnosis not present

## 2022-03-16 DIAGNOSIS — G4733 Obstructive sleep apnea (adult) (pediatric): Secondary | ICD-10-CM | POA: Diagnosis not present

## 2022-03-19 NOTE — Procedures (Signed)
? ?  Patient Name: Raymond Powell, Raymond Powell ?Study Date: 03/16/2022 ?Gender: Male ?D.O.B: [birth year] ?Age (years): 22 ?Referring Provider: Fransico Him MD, ABSM ?Height (inches): 73 ?Interpreting Physician: Fransico Him MD, ABSM ?Weight (lbs): 300 ?RPSGT: Jorge Ny ?BMI: 40 ?MRN: [id number] ?Neck Size: 18.00 ? ?CLINICAL INFORMATION ?The patient is referred for a CPAP titration to treat sleep apnea. ? ?SLEEP STUDY TECHNIQUE ?As per the AASM Manual for the Scoring of Sleep and Associated Events v2.3 (April 2016) with a hypopnea requiring 4% desaturations. ? ?The channels recorded and monitored were frontal, central and occipital EEG, electrooculogram (EOG), submentalis EMG (chin), nasal and oral airflow, thoracic and abdominal wall motion, anterior tibialis EMG, snore microphone, electrocardiogram, and pulse oximetry. Continuous positive airway pressure (CPAP) was initiated at the beginning of the study and titrated to treat sleep-disordered breathing. ? ?MEDICATIONS ?Medications self-administered by patient taken the night of the study : ATENOLOL, ATORVASTATIN, ACETAMINOPHEN W/OXYCODONE ? ?TECHNICIAN COMMENTS ?Comments added by technician: None ?Comments added by scorer: N/A ? ?RESPIRATORY PARAMETERS ?Optimal PAP Pressure (cm): 13  ?AHI at Optimal Pressure (/hr):2.9 ?Overall Minimal O2 (%):84.0  ?Supine % at Optimal Pressure (%):N/A ?Minimal O2 at Optimal Pressure (%): 85.0  ? ?SLEEP ARCHITECTURE ?The study was initiated at 10:18:41 PM and ended at 5:16:28 AM. ? ?Sleep onset time was 1.5 minutes and the sleep efficiency was 89.5%. The total sleep time was 374 minutes. ? ?The patient spent 3.7% of the night in stage N1 sleep, 51.7% in stage N2 sleep, 0.0% in stage N3 and 44.5% in REM.Stage REM latency was 37.5 minutes ? ?Wake after sleep onset was 42.3. Alpha intrusion was absent. Supine sleep was 13.64%. ? ?CARDIAC DATA ?The 2 lead EKG demonstrated sinus rhythm. The mean heart rate was 65.7 beats per minute. Other EKG  findings include: None. ? ?LEG MOVEMENT DATA ?The total Periodic Limb Movements of Sleep (PLMS) were 0. The PLMS index was 0.0. A PLMS index of <15 is considered normal in adults. ? ?IMPRESSIONS ?- An optimal PAP pressure of 13cm H2O was selected for this patient based on the available study data. ?- Central sleep apnea was not noted during this titration (CAI = 2.2/h). ?- Moderate oxygen desaturations were observed during this titration (min O2 = 84.0%). ?- The patient snored with moderate snoring volume during this titration study. ?- No cardiac abnormalities were observed during this study. ?- Clinically significant periodic limb movements were not noted during this study. Arousals associated with PLMs were rare. ? ?DIAGNOSIS ?- Obstructive Sleep Apnea (G47.33) ?- Nocturnal Hypoxemia ? ?RECOMMENDATIONS ?- Recommend a trial of ResMed CPAP at 13 cm H2O with heated humidity and mask of choice.  ?- Avoid alcohol, sedatives and other CNS depressants that may worsen sleep apnea and disrupt normal sleep architecture. ?- Sleep hygiene should be reviewed to assess factors that may improve sleep quality. ?- Weight management and regular exercise should be initiated or continued. ?- Return to Sleep Center for re-evaluation after 6 weeks of therapy ? ?[Electronically signed] 03/19/2022 10:58 AM ? ?Fransico Him MD, ABSM ?Diplomate, Tax adviser of Sleep Medicine ?

## 2022-03-22 ENCOUNTER — Telehealth: Payer: Self-pay | Admitting: *Deleted

## 2022-03-22 DIAGNOSIS — G4733 Obstructive sleep apnea (adult) (pediatric): Secondary | ICD-10-CM

## 2022-03-22 NOTE — Telephone Encounter (Addendum)
The patient has been notified of the result and verbalized understanding.  All questions (if any) were answered. Raymond Powell, [place] 03/22/2022 4:41 PM    Please get overnight Pulse ox on CPAP    Upon patient request DME selection [name] understands he will be contacted by [facility] to set up his cpap. Patient understands to call if [facility] does not contact him with new setup in a timely manner. Patient understands they will be called once confirmation has been received from [facility] that they have received their new machine to schedule 10 week follow up appointment.   [place] notified of new cpap order  Please add to airview Patient was grateful for the call and thanked me.

## 2022-03-22 NOTE — Telephone Encounter (Signed)
-----   Message from Lauralee Evener, [place] sent at 03/20/2022  2:24 PM EDT ----- ? ?----- Message ----- ?From: Sueanne Margarita, MD ?Sent: 03/19/2022  11:00 AM EDT ?To: Cv Div Sleep Studies ? ?Please let patient know that they had a successful PAP titration and let DME know that orders are in EPIC.  Please set up 6 week OV with me.  ?  ? ?

## 2022-04-04 ENCOUNTER — Encounter: Payer: Self-pay | Admitting: *Deleted

## 2022-04-04 NOTE — Telephone Encounter (Signed)
The patient has been notified of the result and verbalized understanding.  All questions (if any) were answered. Raymond Powell, [place] 04/04/2022 3:55 PM    Upon patient request DME selection is [facility] Patient understands he will be contacted by [facility] to set up his cpap. Patient understands to call if [facility] does not contact him with new setup in a timely manner. Patient understands they will be called once confirmation has been received from [facility] that they have received their new machine to schedule 10 week follow up appointment.   [place] notified of new cpap order  Please add to airview Patient was grateful for the call and thanked me.     This encounter was created in error - please disregard.

## 2022-04-04 NOTE — Telephone Encounter (Signed)
-----   Message from Lauralee Evener, [place] sent at 03/20/2022  2:24 PM EDT -----  ----- Message ----- From: Sueanne Margarita, MD Sent: 03/19/2022  11:00 AM EDT To: Cv Div Sleep Studies  Please let patient know that they had a successful PAP titration and let DME know that orders are in EPIC.  Please set up 6 week OV with me.

## 2022-05-07 DIAGNOSIS — E1169 Type 2 diabetes mellitus with other specified complication: Secondary | ICD-10-CM | POA: Diagnosis not present

## 2022-05-07 DIAGNOSIS — E785 Hyperlipidemia, unspecified: Secondary | ICD-10-CM | POA: Diagnosis not present

## 2022-05-07 DIAGNOSIS — Z0000 Encounter for general adult medical examination without abnormal findings: Secondary | ICD-10-CM | POA: Diagnosis not present

## 2022-05-07 DIAGNOSIS — Z79899 Other long term (current) drug therapy: Secondary | ICD-10-CM | POA: Diagnosis not present

## 2022-05-07 DIAGNOSIS — M19041 Primary osteoarthritis, right hand: Secondary | ICD-10-CM | POA: Diagnosis not present

## 2022-05-17 ENCOUNTER — Encounter: Payer: Self-pay | Admitting: Cardiology

## 2022-05-24 DIAGNOSIS — G4733 Obstructive sleep apnea (adult) (pediatric): Secondary | ICD-10-CM | POA: Diagnosis not present

## 2022-06-04 DIAGNOSIS — G4733 Obstructive sleep apnea (adult) (pediatric): Secondary | ICD-10-CM | POA: Diagnosis not present

## 2022-07-02 ENCOUNTER — Other Ambulatory Visit: Payer: Self-pay | Admitting: Cardiology

## 2022-07-02 DIAGNOSIS — I480 Paroxysmal atrial fibrillation: Secondary | ICD-10-CM

## 2022-07-02 DIAGNOSIS — R002 Palpitations: Secondary | ICD-10-CM

## 2022-07-03 ENCOUNTER — Encounter: Payer: Self-pay | Admitting: Cardiology

## 2022-07-03 ENCOUNTER — Other Ambulatory Visit: Payer: Self-pay

## 2022-07-20 ENCOUNTER — Encounter: Payer: Self-pay | Admitting: Cardiology

## 2022-07-20 ENCOUNTER — Ambulatory Visit: Payer: Medicare Other | Attending: Cardiology | Admitting: Cardiology

## 2022-07-20 VITALS — BP 130/82 | HR 81 | Ht 73.0 in | Wt 301.8 lb

## 2022-07-20 DIAGNOSIS — G4731 Primary central sleep apnea: Secondary | ICD-10-CM

## 2022-07-20 NOTE — Patient Instructions (Signed)
Medication Instructions:  Your physician recommends that you continue on your current medications as directed. Please refer to the Current Medication list given to you today.  *If you need a refill on your cardiac medications before your next appointment, please call your pharmacy*  Follow-Up: At [facility], you and your health needs are our priority.  As part of our continuing mission to provide you with exceptional heart care, we have created designated Provider Care Teams.  These Care Teams include your primary Cardiologist (physician) and Advanced Practice Providers (APPs -  Physician Assistants and Nurse Practitioners) who all work together to provide you with the care you need, when you need it.  Your next appointment:   1 year(s)  The format for your next appointment:   In Person  Provider:   Traci Turner, MD     Important Information About [place]       

## 2022-07-20 NOTE — Progress Notes (Signed)
Sleep Medicine CONSULT Note    Date:  07/20/2022   ID:  Raymond Powell, DOB February 17, 1956, MRN [id number]  PCP:  Serita Grammes, MD  Cardiologist: Shirlee More, MD  Electrophysiologist: Reggy Eye, MD  Chief Complaint  Patient presents with   New Patient (Initial Visit)    Obstructive sleep apnea    History of Present Illness:  Raymond Powell is a 66 y.o. male who is being seen today for the evaluation of obstructive sleep apnea at the request of Reggy Eye, MD.  This is a 66 year old male with a history of GERD, hyperlipidemia, hypertension, diabetesAnd atrial flutter/paroxysmal atrial fibrillation status post ablation 10/2020 who recently was seen by Dr. Curt Bears.  He complained of snoring and a home sleep study was ordered.  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  He also had nocturnal hypoxemia with O2 saturations less than 88% for 69 minutes.  He underwent CPAP titration in May 2023 and started on CPAP at 13 cm H2O.  He is now here for evaluation.  He is doing well with his PAP device and thinks that he has gotten used to it.  He tolerates the full face mask and feels the pressure is adequate. He has not dosed off as much watching TV as he used to and he is sleeping much better at night than before.  He denies any significant mouth or nasal dryness or nasal congestion.  He does not think that he snores.    Past Medical History:  Diagnosis Date   Back pain    Cervical radiculopathy 11/21/2017   GERD without esophagitis    Hyperlipidemia    Low back pain 04/16/2012   Lumbar post-laminectomy syndrome 11/18/2017   Migraine    Neck pain 10/01/2012   Neuropathy of upper extremity 10/01/2012   Obesity    On long term drug therapy 11/18/2017   Pain of right shoulder joint on movement 11/21/2017   Piriformis  syndrome 06/17/2012   Pneumonia    Sacroiliitis, not elsewhere classified ([place]) 04/16/2012   Shoulder pain 10/01/2012   Type 2 diabetes mellitus ([place])     Past Surgical History:  Procedure Laterality Date   Arthroscopic knee surgery     ATRIAL FIBRILLATION ABLATION N/A 10/14/2020   Procedure: ATRIAL FIBRILLATION ABLATION;  Surgeon: Constance Haw, MD;  Location: [facility];  Service: Cardiovascular;  Laterality: N/A;   LAMINECTOMY      Current Medications: Current Meds  Medication Sig   apixaban (ELIQUIS) 5 MG TABS tablet Take 1 tablet (5 mg total) by mouth 2 (two) times daily.   atenolol (TENORMIN) 50 MG tablet Take 1 tablet (50 mg total) by mouth daily.   atorvastatin (LIPITOR) 80 MG tablet Take 40 mg by mouth at bedtime.    flecainide (TAMBOCOR) 50 MG tablet TAKE ONE-HALF TABLET BY MOUTH IN THE MORNING AND 1 TABLET BY  MOUTH IN THE EVENING   gabapentin (NEURONTIN) 100 MG capsule Take 100 mg by mouth in the morning and at bedtime.   lansoprazole (PREVACID) 30 MG capsule Take 30 mg by mouth daily.   lidocaine (LIDODERM) 5 % Place 1 patch onto the skin at bedtime as needed (left shoulder). Remove & Discard patch within 12 hours or as directed by MD   meloxicam (MOBIC) 15 MG tablet Take 15 mg  by mouth daily.    oxyCODONE-acetaminophen (PERCOCET) 10-325 MG tablet Take 1 tablet by mouth 5 (five) times daily as needed for pain.     Allergies:   Codeine   Social History   Socioeconomic History   Marital status: Married    Spouse name: Not on file   Number of children: Not on file   Years of education: Not on file   Highest education level: Not on file  Occupational History   Not on file  Tobacco Use   Smoking status: Former   Smokeless tobacco: Never  Vaping Use   Vaping Use: Never used  Substance and Sexual Activity   Alcohol use: Never   Drug use: Never   Sexual activity: Yes  Other Topics Concern   Not on file  Social History Narrative   ** Merged  History Encounter **       Social Determinants of Health   Financial Resource Strain: Not on file  Food Insecurity: Not on file  Transportation Needs: Not on file  Physical Activity: Not on file  Stress: Not on file  Social Connections: Not on file     Family History:  The patient's family history includes CAD in his mother; Ovarian cancer in his sister; Pancreatic cancer in his brother; Stroke in his mother.   ROS:   Please see the history of present illness.    ROS All other systems reviewed and are negative.      No data to display             PHYSICAL EXAM:   VS:  BP 130/82   Pulse 81   Ht '6\' 1"'$  (1.854 m)   Wt (!) 301 lb 12.8 oz (136.9 kg)   SpO2 95%   BMI 39.82 kg/m    GEN: Well nourished, well developed, in no acute distress  HEENT: normal  Neck: no JVD, carotid bruits, or masses Cardiac: RRR; no murmurs, rubs, or gallops,no edema.  Intact distal pulses bilaterally.  Respiratory:  clear to auscultation bilaterally, normal work of breathing GI: soft, nontender, nondistended, + BS MS: no deformity or atrophy  Skin: warm and dry, no rash Neuro:  Alert and Oriented x 3, Strength and sensation are intact Psych: euthymic mood, full affect  Wt Readings from Last 3 Encounters:  07/20/22 (!) 301 lb 12.8 oz (136.9 kg)  03/16/22 300 lb (136.1 kg)  01/22/22 (!) 302 lb (137 kg)      Studies/Labs Reviewed:   Home sleep study, CPAP titration, PAP compliance download  Recent Labs: No results found for requested labs within last 365 days.    Additional studies/ records that were reviewed today include:  Office notes from Dr. Curt Bears    ASSESSMENT:    1. Complex sleep apnea syndrome      PLAN:  In order of problems listed above:  OSA  -Stop bang Score is 6 -HST showed - The patient is tolerating PAP therapy well without any problems. The PAP download performed by his DME was personally reviewed and interpreted by me today and showed an AHI of 1.2 /hr  on 13 cm H2O with 100% compliance in using more than 4 hours nightly.  The patient has been using and benefiting from PAP use and will continue to benefit from therapy.  -I will get an ONO on CPAP to make sure he does not have any residual nocturnal hypoxemia   Time Spent: 20 minutes total time of encounter, including 15 minutes spent in  face-to-face patient care on the date of this encounter. This time includes coordination of care and counseling regarding above mentioned problem list. Remainder of non-face-to-face time involved reviewing chart documents/testing relevant to the patient encounter and documentation in the medical record. I have independently reviewed documentation from referring provider  Medication Adjustments/Labs and Tests Ordered: Current medicines are reviewed at length with the patient today.  Concerns regarding medicines are outlined above.  Medication changes, Labs and Tests ordered today are listed in the Patient Instructions below.  There are no Patient Instructions on file for this visit.   Signed, Fransico Him, MD  07/20/2022 2:45 PM    [facility] [address], [place], [place]  [address] Phone: [phone]; Fax: [phone]

## 2022-08-06 ENCOUNTER — Encounter: Payer: Self-pay | Admitting: Cardiology

## 2022-08-06 ENCOUNTER — Ambulatory Visit: Payer: Medicare Other | Attending: Cardiology | Admitting: Cardiology

## 2022-08-06 VITALS — BP 138/84 | HR 73 | Ht 73.0 in | Wt 302.0 lb

## 2022-08-06 DIAGNOSIS — I480 Paroxysmal atrial fibrillation: Secondary | ICD-10-CM | POA: Diagnosis not present

## 2022-08-06 DIAGNOSIS — D6869 Other thrombophilia: Secondary | ICD-10-CM
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
## 2022-09-06 ENCOUNTER — Other Ambulatory Visit: Payer: Self-pay | Admitting: Cardiology

## 2022-09-07 ENCOUNTER — Other Ambulatory Visit: Payer: Self-pay

## 2022-09-10 ENCOUNTER — Encounter (INDEPENDENT_AMBULATORY_CARE_PROVIDER_SITE_OTHER): Payer: Self-pay

## 2022-10-25 ENCOUNTER — Other Ambulatory Visit: Payer: Self-pay | Admitting: Cardiology

## 2022-10-25 DIAGNOSIS — R002 Palpitations: Secondary | ICD-10-CM

## 2022-10-25 DIAGNOSIS — I480 Paroxysmal atrial fibrillation: Secondary | ICD-10-CM

## 2022-10-26 NOTE — Telephone Encounter (Signed)
Prescription refill request for Eliquis received. Indication:afib Last office visit:9/23 Scr:1.0 Age: 66 Weight:137  kg  Prescription refilled

## 2022-12-11 ENCOUNTER — Encounter: Payer: Self-pay | Admitting: Gastroenterology

## 2022-12-20 LAB — EXTERNAL GENERIC LAB PROCEDURE: COLOGUARD: NEGATIVE

## 2022-12-20 LAB — COLOGUARD: COLOGUARD: NEGATIVE

## 2023-03-19 ENCOUNTER — Other Ambulatory Visit: Payer: Self-pay | Admitting: Cardiology

## 2023-03-19 DIAGNOSIS — I480 Paroxysmal atrial fibrillation: Secondary | ICD-10-CM

## 2023-03-19 DIAGNOSIS — R002 Palpitations: Secondary | ICD-10-CM

## 2023-05-23 ENCOUNTER — Other Ambulatory Visit: Payer: Self-pay | Admitting: Cardiology

## 2023-07-13 ENCOUNTER — Other Ambulatory Visit: Payer: Self-pay | Admitting: Cardiology

## 2023-07-13 DIAGNOSIS — R002 Palpitations: Secondary | ICD-10-CM

## 2023-07-13 DIAGNOSIS — I480 Paroxysmal atrial fibrillation: Secondary | ICD-10-CM

## 2023-07-16 NOTE — Telephone Encounter (Signed)
Eliquis 5mg  refill request received. Patient is 67 years old, weight-137kg, Crea-0.97 on 06/10/23 via [facility] Everywhere from  [facility], Diagnosis-Afib, and last seen by Dr. Elberta Fortis on 08/06/22 & has an appt pending on 09/09/23. Dose is appropriate based on dosing criteria. Will send in refill to requested pharmacy.

## 2023-08-06 ENCOUNTER — Telehealth: Payer: Self-pay | Admitting: *Deleted

## 2023-08-06 NOTE — Telephone Encounter (Signed)
Please advise holding Eliquis prior to cervical nerve root block.   Thank you!  DW

## 2023-08-06 NOTE — Telephone Encounter (Signed)
Patient with diagnosis of afib on Eliquis for anticoagulation.    Procedure: CERVICAL SELECTIVE NERVE ROOT BLOCK  Date of procedure: 08/20/23   CHA2DS2-VASc Score = 3   This indicates a 3.2% annual risk of stroke. The patient's score is based upon: CHF History: 0 HTN History: 1 Diabetes History: 1 Stroke History: 0 Vascular Disease History: 0 Age Score: 1 Gender Score: 0      CrCl 105 ml/min Platelet count 226  Per office protocol, patient can hold Eliquis for 3 days prior to procedure.    **This guidance is not considered finalized until pre-operative APP has relayed final recommendations.**

## 2023-08-06 NOTE — Telephone Encounter (Signed)
Pre-operative Risk Assessment    Patient Name: Raymond Powell  DOB: 12-May-1956 MRN: [id number]   LAST APPT:  08/06/22 FUTURE APPT:  09/09/23   Request for Surgical Clearance    Procedure:   CERVICAL SELECTIVE NERVE ROOT BLOCK  Date of Surgery:  Clearance 08/20/23                                 Surgeon:  DR. Ethelene Hal Surgeon's Group or Practice Name:  [facility] Phone number:  [phone] Fax number:  [phone]   Type of Clearance Requested:   - Pharmacy:  Hold Apixaban (Eliquis) NOT INDICATED HOW LONG    Type of Anesthesia:  Not Indicated   Additional requests/questions:    Wilhemina Cash   08/06/2023, 10:49 AM

## 2023-08-06 NOTE — Telephone Encounter (Signed)
Name: Raymond Powell  DOB: January 19, 1956  MRN: [id number]   Primary Cardiologist: Norman Herrlich, MD  Chart reviewed as part of pre-operative protocol coverage.   We have been asked for guidance to hold oral anticoagulation for upcoming procedure. Per our clinical pharmacist:  Per office protocol, patient can hold Eliquis for 3 days prior to procedure.     I will route this recommendation to the requesting party via Epic fax function and remove from pre-op pool. Please call with questions.  Roe Rutherford Nima Kemppainen, PA 08/06/2023, 1:36 PM

## 2023-08-29 ENCOUNTER — Other Ambulatory Visit: Payer: Self-pay | Admitting: Cardiology

## 2023-08-29 DIAGNOSIS — I480 Paroxysmal atrial fibrillation: Secondary | ICD-10-CM

## 2023-08-29 DIAGNOSIS — R002 Palpitations: Secondary | ICD-10-CM

## 2023-09-09 ENCOUNTER — Ambulatory Visit: Payer: Medicare Other | Attending: Cardiology | Admitting: Cardiology

## 2023-09-09 ENCOUNTER — Encounter: Payer: Self-pay | Admitting: Cardiology

## 2023-09-09 VITALS — BP 130/80 | HR 73 | Ht 73.0 in | Wt 298.4 lb

## 2023-09-09 DIAGNOSIS — I480 Paroxysmal atrial fibrillation: Secondary | ICD-10-CM | POA: Diagnosis not present

## 2023-09-09 DIAGNOSIS — D6869 Other thrombophilia: Secondary | ICD-10-CM

## 2023-09-09 DIAGNOSIS — I10 Essential (primary) hypertension: Secondary | ICD-10-CM

## 2023-09-09 DIAGNOSIS — G4733 Obstructive sleep apnea (adult) (pediatric): Secondary | ICD-10-CM | POA: Diagnosis not present

## 2023-09-09 NOTE — Progress Notes (Signed)
  Electrophysiology Office Note:   Date:  09/09/2023  ID:  Raymond Powell, DOB Mar 15, 1956, MRN [id number]  Primary Cardiologist: Norman Herrlich, MD Electrophysiologist: Regan Lemming, MD      History of Present Illness:   Raymond Powell is a 67 y.o. male with h/o atrial fibrillation/flutter post ablation, sleep apnea, obesity, diabetes seen today for routine electrophysiology followup.   Since last being seen in our clinic the patient reports doing well.  He has noted no further episodes of atrial fibrillation.  He is able to do all of his daily activities without restriction.  Happy with his control.  he denies chest pain, palpitations, dyspnea, PND, orthopnea, nausea, vomiting, dizziness, syncope, edema, weight gain, or early satiety.   Review of systems complete and found to be negative unless listed in HPI.   EP Information / Studies Reviewed:    EKG is ordered today. Personal review as below.  EKG Interpretation Date/Time:  Monday September 09 2023 14:38:25 EDT Ventricular Rate:  73 PR Interval:  202 QRS Duration:  96 QT Interval:  412 QTC Calculation: 453 R Axis:   -33  Text Interpretation: Normal sinus rhythm Left axis deviation Low voltage QRS Abnormal ECG When compared with ECG of 14-Nov-2020 13:52, No significant change was found Confirmed by Maverick Dieudonne ([id number]) on 09/09/2023 2:42:16 PM     Risk Assessment/Calculations:    CHA2DS2-VASc Score = 3   This indicates a 3.2% annual risk of stroke. The patient's score is based upon: CHF History: 0 HTN History: 1 Diabetes History: 1 Stroke History: 0 Vascular Disease History: 0 Age Score: 1 Gender Score: 0        STOP-Bang Score:          Physical Exam:   VS:  BP 130/80   Pulse 73   Ht 6\' 1"  (1.854 m)   Wt 298 lb 6.4 oz (135.4 kg)   SpO2 94%   BMI 39.37 kg/m    Wt Readings from Last 3 Encounters:  09/09/23 298 lb 6.4 oz (135.4 kg)  08/06/22 (!) 302 lb (137 kg)  07/20/22 (!) 301 lb 12.8 oz (136.9 kg)      GEN: Well nourished, well developed in no acute distress NECK: No JVD; No carotid bruits CARDIAC: Regular rate and rhythm, no murmurs, rubs, gallops RESPIRATORY:  Clear to auscultation without rales, wheezing or rhonchi  ABDOMEN: Soft, non-tender, non-distended EXTREMITIES:  No edema; No deformity   ASSESSMENT AND PLAN:    1.  Paroxysmal atrial fibrillation/flutter: Post ablation 10/14/2020.  Currently on flecainide and atenolol.  Remains in sinus rhythm without further episodes.  [name] continue with current management.  2.  Secondary hypercoagulable state: Currently on Eliquis for atrial fibrillation  3.  Hypertension: Currently well-controlled  4.  Obstructive sleep apnea: CPAP compliance encouraged  Follow up with Dr. Elberta Fortis in 12 months  Signed, Alhaji Mcneal Jorja Loa, MD

## 2023-09-09 NOTE — Patient Instructions (Signed)

## 2023-09-30 ENCOUNTER — Encounter (HOSPITAL_BASED_OUTPATIENT_CLINIC_OR_DEPARTMENT_OTHER): Payer: Self-pay | Admitting: Cardiology

## 2023-10-27 ENCOUNTER — Other Ambulatory Visit: Payer: Self-pay | Admitting: Cardiology

## 2023-10-27 DIAGNOSIS — I480 Paroxysmal atrial fibrillation: Secondary | ICD-10-CM

## 2023-10-27 DIAGNOSIS — R002 Palpitations: Secondary | ICD-10-CM

## 2023-10-29 ENCOUNTER — Ambulatory Visit: Payer: Medicare Other | Attending: Cardiology | Admitting: Cardiology

## 2023-10-29 ENCOUNTER — Encounter: Payer: Self-pay | Admitting: Cardiology

## 2023-10-29 VITALS — BP 108/80 | HR 80 | Resp 16 | Ht 73.0 in | Wt 300.0 lb

## 2023-10-29 DIAGNOSIS — G4733 Obstructive sleep apnea (adult) (pediatric): Secondary | ICD-10-CM | POA: Diagnosis not present

## 2023-10-29 DIAGNOSIS — I10 Essential (primary) hypertension: Secondary | ICD-10-CM

## 2023-10-29 NOTE — Patient Instructions (Signed)
Medication Instructions:  Your physician recommends that you continue on your current medications as directed. Please refer to the Current Medication list given to you today.  *If you need a refill on your cardiac medications before your next appointment, please call your pharmacy*   Lab Work: None.  If you have labs (blood work) drawn today and your tests are completely normal, you will receive your results only by: MyChart Message (if you have MyChart) OR A paper copy in the mail If you have any lab test that is abnormal or we need to change your treatment, we will call you to review the results.   Testing/Procedures: None.   Follow-Up:  Your next appointment:   1 year(s)  Provider:   Dr. Armanda Magic, MD    Other Instructions Dr. Mayford Knife has ordered new cpap supplies for you. Your DME company will contact you regarding pick-up and delivery.

## 2023-10-29 NOTE — Progress Notes (Signed)
Sleep Medicine  Note    Date:  10/29/2023   ID:  Raymond Powell, DOB 07/24/1956, MRN [id number]  PCP:  Everlean Cherry, MD  Cardiologist: Norman Herrlich, MD  Electrophysiologist: Lorenso Courier, MD  Chief Complaint  Patient presents with   Sleep Apnea   Hypertension    History of Present Illness:  Raymond Powell is a 67 y.o. male  with a history of GERD, hyperlipidemia, hypertension, diabetesAnd atrial flutter/paroxysmal atrial fibrillation status post ablation 10/2020 who recently was seen by Dr. Elberta Fortis.  He complained of snoring and a home sleep study was ordered.  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  He also had nocturnal hypoxemia with O2 saturations less than 88% for 69 minutes.  He underwent CPAP titration in May 2023 and started on CPAP at 13 cm H2O.    He is doing well with his PAP device and thinks that he has gotten used to it.  He tolerates the mask and feels the pressure is adequate.  Since going on PAP he feels rested in the am and has no significant daytime sleepiness.  He has some mild mouth  dryness.  He does not think that he snores.    Past Surgical History:  Procedure Laterality Date   Arthroscopic knee surgery     ATRIAL FIBRILLATION ABLATION N/A 10/14/2020   Procedure: ATRIAL FIBRILLATION ABLATION;  Surgeon: Regan Lemming, MD;  [facility]: [facility];  Service: Cardiovascular;  Laterality: N/A;   LAMINECTOMY      Current Medications: Current Meds  Medication Sig   apixaban (ELIQUIS) 5 MG TABS tablet TAKE 1 TABLET BY MOUTH TWICE  DAILY   atenolol (TENORMIN) 50 MG tablet TAKE 1 TABLET BY MOUTH DAILY   atorvastatin (LIPITOR) 80 MG tablet Take 1 tablet by mouth at bedtime.   flecainide (TAMBOCOR) 50 MG tablet TAKE ONE-HALF TABLET BY MOUTH IN THE MORNING AND 1 TABLET BY  MOUTH IN THE  EVENING   gabapentin (NEURONTIN) 100 MG capsule Take 100 mg by mouth in the morning and at bedtime.   lansoprazole (PREVACID) 15 MG capsule Take 1 capsule by mouth daily.   lidocaine (LIDODERM) 5 % Place 1 patch onto the skin at bedtime as needed (left shoulder). Remove & Discard patch within 12 hours or as directed by MD   lisinopril (ZESTRIL) 2.5 MG tablet Take 2.5 mg by mouth daily.   meloxicam (MOBIC) 15 MG tablet Take 15 mg by mouth daily.    oxyCODONE-acetaminophen (PERCOCET) 10-325 MG tablet Take 1 tablet by mouth 5 (five) times daily as needed for pain.     Allergies:   Codeine   Social History   Socioeconomic History   Marital status: Married    Spouse name: Not on file   Number of children: 0   Years of education: Not on file   Highest education level: Not on file  Occupational History   Not on file  Tobacco Use   Smoking status: Former    Current packs/day: 0.00    Types: Cigarettes    Quit date: 1990    Years since quitting: 34.9   Smokeless tobacco: Never  Vaping Use   Vaping status: Never Used  Substance and Sexual Activity   Alcohol use: Never   Drug use: Never   Sexual activity: Yes  Other Topics Concern   Not on file  Social History Narrative   ** Merged History Encounter **       Social Drivers of Health   Financial Resource Strain: Not on file  Food Insecurity: Low Risk  (05/29/2023)   Received from [facility]   Hunger Vital Sign    Worried About Running Out of Food in the Last Year: Never true    Ran Out of Food in the Last Year: Never true  Transportation Needs: Not on file (05/29/2023)  Physical Activity: Not on file  Stress: Not on file  Social Connections: Not on file     Family History:  The patient's family history includes CAD in his mother; Heart attack in his mother; Heart disease in his mother; Ovarian cancer in his sister; Pancreatic cancer in his brother; Stroke in his mother.   ROS:   Please see the history of present illness.     ROS All other systems reviewed and are negative.      No data to display             PHYSICAL EXAM:   VS:  BP 108/80 (BP Location: Right Arm, Patient Position: Sitting, Cuff Size: Large)   Pulse 80   Resp 16   Ht 6\' 1"  (1.854 m)   Wt 300 lb (136.1 kg)   SpO2 94%   BMI 39.58 kg/m    GEN: Well nourished, well developed in no acute distress HEENT: Normal NECK: No JVD; No carotid bruits LYMPHATICS: No lymphadenopathy CARDIAC:RRR, no murmurs, rubs, gallops RESPIRATORY:  Clear to auscultation without rales, wheezing or rhonchi  ABDOMEN: Soft, non-tender, non-distended MUSCULOSKELETAL:  No edema; No deformity  SKIN: Warm and dry NEUROLOGIC:  Alert and oriented x 3 PSYCHIATRIC:  Normal affect  Wt Readings from Last 3 Encounters:  10/29/23 300 lb (136.1 kg)  09/09/23 298 lb 6.4 oz (135.4 kg)  08/06/22 (!) 302 lb (137 kg)      Studies/Labs Reviewed:   Home sleep study, CPAP titration, PAP compliance download  Recent Labs: No results found for requested labs within last 365 days.    Additional studies/ records that were reviewed today include:  Office notes from Dr. Elberta Fortis    ASSESSMENT:    1. OSA (obstructive sleep apnea)   2. Essential hypertension       PLAN:  In order of problems listed above:  OSA - The patient is tolerating PAP therapy well without any problems. The PAP download performed by his DME was personally reviewed and interpreted by me today and showed an AHI of 2.2 /hr on 13 cm H2O with 100 % compliance in using more than 4 hours nightly.  The patient has been using and benefiting from PAP use and will continue to benefit from therapy.  -order PAP supplies  Hypertension -BP controlled on exam today -Continue prescription drug management with atenolol 50 mg daily and lisinopril 2.5 mg daily with as needed refills  Time Spent: 15 minutes total time of encounter, including 109 minutes spent in face-to-face patient care on the date of this  encounter. This time includes coordination of care and counseling regarding above mentioned problem list. Remainder of non-face-to-face time involved reviewing chart documents/testing relevant to the patient encounter and documentation in the medical record. I have independently reviewed documentation from referring provider  Medication Adjustments/Labs and Tests Ordered: Current medicines are reviewed at length with the patient today.  Concerns regarding medicines are outlined above.  Medication changes, Labs  and Tests ordered today are listed in the Patient Instructions below.  There are no Patient Instructions on file for this visit.   Signed, Armanda Magic, MD  10/29/2023 3:12 PM    [facility] [address] [place], [place], [place]  [address] Phone: [phone]; Fax: [phone]

## 2023-11-01 ENCOUNTER — Telehealth: Payer: Self-pay | Admitting: *Deleted

## 2023-11-01 DIAGNOSIS — G4733 Obstructive sleep apnea (adult) (pediatric): Secondary | ICD-10-CM

## 2023-11-01 DIAGNOSIS — I10 Essential (primary) hypertension: Secondary | ICD-10-CM

## 2023-11-01 NOTE — Telephone Encounter (Signed)
-----   Message from Nurse Alcario Drought E sent at 10/29/2023  3:19 PM EST ----- Regarding: cpap supplies Dr. Mayford Knife would like to order cpap supplies for this patient. Thanks, Alcario Drought, RN

## 2023-11-01 NOTE — Telephone Encounter (Signed)
Order placed to [facility] patient via fax.

## 2023-11-27 ENCOUNTER — Telehealth: Payer: Self-pay

## 2023-11-27 NOTE — Telephone Encounter (Signed)
 Pharmacy please advise on holding Eliquis  prior to root canal surgery scheduled for TBD. Thank you.

## 2023-11-27 NOTE — Telephone Encounter (Signed)
   Pre-operative Risk Assessment    Patient Name: Raymond Powell  DOB: [birth year] MRN: [id number]   Date of last office visit: 10/29/23 Date of next office visit: Not scheduled  Request for Surgical Clearance    Procedure:   Root Canal Therapy  Date of Surgery:  Clearance TBD                                Surgeon:  Not Indicated Surgeon's Group or Practice Name:  [facility] Phone number:  [phone] Fax number:  [phone]   Type of Clearance Requested:   - Medical  - Pharmacy:  Hold Apixaban  (Eliquis )     Type of Anesthesia:  Not Indicated   Additional requests/questions:    Gardiner Jumper   11/27/2023, 10:08 AM

## 2023-11-28 NOTE — Telephone Encounter (Signed)
We do not routinely hold anticoagulation for root canal procedures.

## 2023-11-28 NOTE — Telephone Encounter (Signed)
   Patient Name: Raymond Powell  DOB: 1956-03-21 MRN: [id number]  Primary Cardiologist: Norman Herrlich, MD  Chart reviewed as part of pre-operative protocol coverage.    Simple dental extractions (i.e. 1-2 teeth), root canals are considered low risk procedures per guidelines and generally do not require any specific cardiac clearance. It is also generally accepted that for simple extractions and dental cleanings, there is no need to interrupt blood thinner therapy.  SBE prophylaxis is not required for the patient from a cardiac standpoint.  I will route this recommendation to the requesting party via Epic fax function and remove from pre-op pool.  Please call with questions.  Joylene Grapes, NP 11/28/2023, 4:29 PM

## 2023-11-29 ENCOUNTER — Telehealth: Payer: Self-pay | Admitting: Cardiology

## 2023-11-29 NOTE — Telephone Encounter (Signed)
Kelly from office is requesting a callback to get clarity on clearance that was faxed over to them due to he stating there's some contradicting information and no clarify on if Eliquis is being held or not. Please advise

## 2023-11-29 NOTE — Telephone Encounter (Signed)
Returned call to [place] and left message to clarify the pt does not need to hold Eliquis and does not need SBE/ABX prior to dental procedure. I will fax these notes as FYI.

## 2023-12-01 ENCOUNTER — Other Ambulatory Visit: Payer: Self-pay | Admitting: Cardiology

## 2024-01-22 ENCOUNTER — Other Ambulatory Visit: Payer: Self-pay | Admitting: Cardiology

## 2024-01-22 DIAGNOSIS — I480 Paroxysmal atrial fibrillation: Secondary | ICD-10-CM

## 2024-01-22 DIAGNOSIS — R002 Palpitations: Secondary | ICD-10-CM

## 2024-01-22 NOTE — Telephone Encounter (Signed)
 Prescription refill request for Eliquis received. Indication:afib Last office visit:12/24 Scr:1.05  1/25 Age: 68 Weight:136.1  kg  Prescription refilled

## 2024-04-18 ENCOUNTER — Emergency Department (HOSPITAL_COMMUNITY)

## 2024-04-18 ENCOUNTER — Encounter (HOSPITAL_COMMUNITY): Payer: Self-pay

## 2024-04-18 ENCOUNTER — Observation Stay (HOSPITAL_COMMUNITY)
Admission: EM | Admit: 2024-04-18 | Discharge: 2024-04-19 | Disposition: A | Discharge status: Home / Self Care | Attending: Internal Medicine | Admitting: Internal Medicine

## 2024-04-18 DIAGNOSIS — Z79899 Other long term (current) drug therapy: Secondary | ICD-10-CM | POA: Diagnosis not present

## 2024-04-18 DIAGNOSIS — S52351A Displaced comminuted fracture of shaft of radius, right arm, initial encounter for closed fracture: Secondary | ICD-10-CM | POA: Diagnosis not present

## 2024-04-18 DIAGNOSIS — E119 Type 2 diabetes mellitus without complications: Secondary | ICD-10-CM

## 2024-04-18 DIAGNOSIS — E669 Obesity, unspecified: Secondary | ICD-10-CM | POA: Diagnosis not present

## 2024-04-18 DIAGNOSIS — Z7901 Long term (current) use of anticoagulants: Secondary | ICD-10-CM | POA: Diagnosis not present

## 2024-04-18 DIAGNOSIS — I4891 Unspecified atrial fibrillation: Secondary | ICD-10-CM | POA: Diagnosis not present

## 2024-04-18 DIAGNOSIS — M549 Dorsalgia, unspecified: Secondary | ICD-10-CM | POA: Diagnosis present

## 2024-04-18 DIAGNOSIS — S52601A Unspecified fracture of lower end of right ulna, initial encounter for closed fracture: Secondary | ICD-10-CM | POA: Insufficient documentation

## 2024-04-18 DIAGNOSIS — Y92009 Unspecified place in unspecified non-institutional (private) residence as the place of occurrence of the external cause: Secondary | ICD-10-CM | POA: Insufficient documentation

## 2024-04-18 DIAGNOSIS — E785 Hyperlipidemia, unspecified: Secondary | ICD-10-CM | POA: Diagnosis present

## 2024-04-18 DIAGNOSIS — G4739 Other sleep apnea: Secondary | ICD-10-CM | POA: Diagnosis not present

## 2024-04-18 DIAGNOSIS — K219 Gastro-esophageal reflux disease without esophagitis: Secondary | ICD-10-CM | POA: Diagnosis not present

## 2024-04-18 DIAGNOSIS — S52502A Unspecified fracture of the lower end of left radius, initial encounter for closed fracture: Principal | ICD-10-CM

## 2024-04-18 DIAGNOSIS — W010XXA Fall on same level from slipping, tripping and stumbling without subsequent striking against object, initial encounter: Secondary | ICD-10-CM | POA: Insufficient documentation

## 2024-04-18 DIAGNOSIS — I10 Essential (primary) hypertension: Secondary | ICD-10-CM | POA: Diagnosis present

## 2024-04-18 DIAGNOSIS — Z87891 Personal history of nicotine dependence: Secondary | ICD-10-CM | POA: Diagnosis not present

## 2024-04-18 LAB — BASIC METABOLIC PANEL WITH GFR
Anion gap: 11 (ref 5–15)
BUN: 19 mg/dL (ref 8–23)
CO2: 23 mmol/L (ref 22–32)
Calcium: 9.2 mg/dL (ref 8.9–10.3)
Chloride: 103 mmol/L (ref 98–111)
Creatinine, Ser: 0.95 mg/dL (ref 0.61–1.24)
GFR, Estimated: >60 mL/min (ref >60)
Glucose, Bld: 182 mg/dL — ABNORMAL HIGH (ref 70–99)
Potassium: 4.4 mmol/L (ref 3.5–5.1)
Sodium: 137 mmol/L (ref 135–145)

## 2024-04-18 LAB — CBC
HCT: 40.5 % (ref 39.0–52.0)
Hemoglobin: 13.5 g/dL (ref 13.0–17.0)
MCH: 29.4 pg (ref 26.0–34.0)
MCHC: 33.3 g/dL (ref 30.0–36.0)
MCV: 88.2 fL (ref 80.0–100.0)
Platelets: 256 10*3/uL (ref 150–400)
RBC: 4.59 MIL/uL (ref 4.22–5.81)
RDW: 12.6 % (ref 11.5–15.5)
WBC: 15 10*3/uL — ABNORMAL HIGH (ref 4.0–10.5)
nRBC: 0 % (ref 0.0–0.2)

## 2024-04-18 LAB — HIV ANTIBODY (ROUTINE TESTING W REFLEX): HIV Screen 4th Generation wRfx: NONREACTIVE

## 2024-04-18 LAB — GLUCOSE, CAPILLARY
Glucose-Capillary: 154 mg/dL — ABNORMAL HIGH (ref 70–99)
Glucose-Capillary: 192 mg/dL — ABNORMAL HIGH (ref 70–99)

## 2024-04-18 MED ORDER — FLECAINIDE ACETATE 50 MG PO TABS: 25.0000 mg | ORAL_TABLET | Freq: Two times a day (BID) | ORAL | Status: DC

## 2024-04-18 MED ORDER — PANTOPRAZOLE SODIUM 40 MG PO TBEC
40.0000 mg | DELAYED_RELEASE_TABLET | Freq: Every day | ORAL | Status: DC
  Administered 2024-04-19: 40 mg via ORAL
  Filled 2024-04-18: qty 1

## 2024-04-18 MED ORDER — DOCUSATE SODIUM 100 MG PO CAPS
100.0000 mg | ORAL_CAPSULE | Freq: Two times a day (BID) | ORAL | Status: DC
  Filled 2024-04-18: qty 1

## 2024-04-18 MED ORDER — ACETAMINOPHEN 325 MG PO TABS: 650.0000 mg | ORAL_TABLET | Freq: Four times a day (QID) | ORAL | Status: DC | PRN

## 2024-04-18 MED ORDER — HYDROMORPHONE HCL 1 MG/ML IJ SOLN
1.0000 mg | Freq: Once | INTRAMUSCULAR | Status: AC
  Administered 2024-04-18: 1 mg via INTRAVENOUS
  Filled 2024-04-18: qty 1

## 2024-04-18 MED ORDER — BISACODYL 5 MG PO TBEC: 5.0000 mg | DELAYED_RELEASE_TABLET | Freq: Every day | ORAL | Status: DC | PRN

## 2024-04-18 MED ORDER — ATORVASTATIN CALCIUM 80 MG PO TABS
80.0000 mg | ORAL_TABLET | Freq: Every day | ORAL | Status: DC
  Administered 2024-04-18: 80 mg via ORAL
  Filled 2024-04-18: qty 2

## 2024-04-18 MED ORDER — FLECAINIDE ACETATE 50 MG PO TABS
50.0000 mg | ORAL_TABLET | Freq: Every day | ORAL | Status: DC
  Administered 2024-04-18: 50 mg via ORAL
  Filled 2024-04-18 (×2): qty 1

## 2024-04-18 MED ORDER — ONDANSETRON HCL 4 MG/2ML IJ SOLN: 4.0000 mg | Freq: Four times a day (QID) | INTRAMUSCULAR | Status: DC | PRN

## 2024-04-18 MED ORDER — HYDROMORPHONE HCL 1 MG/ML IJ SOLN
1.0000 mg | INTRAMUSCULAR | Status: DC | PRN
  Filled 2024-04-18: qty 1

## 2024-04-18 MED ORDER — ONDANSETRON HCL 4 MG PO TABS: 4.0000 mg | ORAL_TABLET | Freq: Four times a day (QID) | ORAL | Status: DC | PRN

## 2024-04-18 MED ORDER — OXYCODONE-ACETAMINOPHEN 10-325 MG PO TABS: 1.0000 | ORAL_TABLET | Freq: Every day | ORAL | Status: DC | PRN

## 2024-04-18 MED ORDER — LIDOCAINE HCL (PF) 1 % IJ SOLN
10.0000 mL | Freq: Once | INTRAMUSCULAR | Status: AC
  Administered 2024-04-18: 10 mL
  Filled 2024-04-18: qty 10

## 2024-04-18 MED ORDER — HEPARIN SODIUM (PORCINE) 5000 UNIT/ML IJ SOLN
5000.0000 [IU] | Freq: Three times a day (TID) | INTRAMUSCULAR | Status: DC
  Administered 2024-04-18 – 2024-04-19 (×2): 5000 [IU] via SUBCUTANEOUS
  Filled 2024-04-18 (×2): qty 1

## 2024-04-18 MED ORDER — ATENOLOL 50 MG PO TABS
50.0000 mg | ORAL_TABLET | Freq: Every day | ORAL | Status: DC
  Administered 2024-04-19: 50 mg via ORAL
  Filled 2024-04-18: qty 2

## 2024-04-18 MED ORDER — LISINOPRIL 5 MG PO TABS
2.5000 mg | ORAL_TABLET | Freq: Every day | ORAL | Status: DC
  Administered 2024-04-19: 2.5 mg via ORAL
  Filled 2024-04-18 (×2): qty 1

## 2024-04-18 MED ORDER — ACETAMINOPHEN 650 MG RE SUPP: 650.0000 mg | Freq: Four times a day (QID) | RECTAL | Status: DC | PRN

## 2024-04-18 MED ORDER — LACTATED RINGERS IV SOLN
INTRAVENOUS | Status: DC
Start: 2024-04-19 — End: 2024-04-19

## 2024-04-18 MED ORDER — GABAPENTIN 100 MG PO CAPS
100.0000 mg | ORAL_CAPSULE | Freq: Two times a day (BID) | ORAL | Status: DC
  Filled 2024-04-18: qty 1

## 2024-04-18 MED ORDER — BUPIVACAINE HCL (PF) 0.25 % IJ SOLN
10.0000 mL | Freq: Once | INTRAMUSCULAR | Status: AC
  Administered 2024-04-18: 10 mL
  Filled 2024-04-18: qty 30

## 2024-04-18 MED ORDER — HYDROMORPHONE HCL 1 MG/ML IJ SOLN
1.0000 mg | INTRAMUSCULAR | Status: DC | PRN
  Administered 2024-04-18 – 2024-04-19 (×4): 1 mg via INTRAVENOUS
  Filled 2024-04-18 (×3): qty 1

## 2024-04-18 MED ORDER — LIDOCAINE 5 % EX PTCH: 1.0000 | MEDICATED_PATCH | Freq: Every evening | CUTANEOUS | Status: DC | PRN

## 2024-04-18 MED ORDER — OXYCODONE HCL 5 MG PO TABS
5.0000 mg | ORAL_TABLET | Freq: Every day | ORAL | Status: DC | PRN
  Administered 2024-04-18 – 2024-04-19 (×3): 5 mg via ORAL
  Filled 2024-04-18 (×3): qty 1

## 2024-04-18 MED ORDER — POLYETHYLENE GLYCOL 3350 17 G PO PACK
17.0000 g | PACK | Freq: Every day | ORAL | Status: DC | PRN
Start: 2024-04-18 — End: 2024-04-19

## 2024-04-18 MED ORDER — INSULIN ASPART 100 UNIT/ML IJ SOLN
0.0000 [IU] | Freq: Three times a day (TID) | INTRAMUSCULAR | Status: DC
  Administered 2024-04-19: 3 [IU] via SUBCUTANEOUS

## 2024-04-18 MED ORDER — OXYCODONE-ACETAMINOPHEN 5-325 MG PO TABS
1.0000 | ORAL_TABLET | Freq: Every day | ORAL | Status: DC | PRN
  Administered 2024-04-18 – 2024-04-19 (×3): 1 via ORAL
  Filled 2024-04-18 (×3): qty 1

## 2024-04-18 MED ORDER — HYDRALAZINE HCL 20 MG/ML IJ SOLN: 5.0000 mg | INTRAMUSCULAR | Status: DC | PRN

## 2024-04-18 MED ORDER — FLECAINIDE ACETATE 50 MG PO TABS
25.0000 mg | ORAL_TABLET | Freq: Every day | ORAL | Status: DC
  Administered 2024-04-19: 25 mg via ORAL
  Filled 2024-04-18: qty 1

## 2024-04-18 NOTE — Progress Notes (Signed)
Pt. Refused cpap. 

## 2024-04-18 NOTE — Consult Note (Signed)
 Reason for Consult:L wrist fracture Referring Physician: ER  CC:I fell and heard my wrist pop  HPI:  Raymond Powell is an 68 y.o. right handed male who presents with  fall onto L wrist.      .   Pain is rated at    8/10 and is described as sharp.  Pain is constant.  Pain is made better by rest/immobilization, worse with motion.   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  He also had nocturnal hypoxemia with O2 saturations less than 88% for 69 minutes. On CPAP at 13cm H2O   Complex sleep apnea syndrome    severe obstructive sleep apnea with an AHI of 53.7/h and mild central sleep apnea with central AHI of 9.8/h with 11% of the time in Cheyne-Stokes respirations.  On CPAP at 13cm H2O   GERD without esophagitis    Hyperlipidemia    Low back pain 04/16/2012   Lumbar post-laminectomy syndrome 11/18/2017   Migraine    Neck pain 10/01/2012   Neuropathy of upper extremity 10/01/2012   Obesity    On long term drug therapy 11/18/2017   Pain of right shoulder joint on movement 11/21/2017   Piriformis syndrome 06/17/2012   Pneumonia    Sacroiliitis, not elsewhere classified (HCC) 04/16/2012   Shoulder pain 10/01/2012   Type 2 diabetes mellitus (HCC)     Past Surgical History:  Procedure Laterality Date   Arthroscopic knee surgery     ATRIAL FIBRILLATION ABLATION N/A 10/14/2020   Procedure: ATRIAL FIBRILLATION ABLATION;  Surgeon: Lei Pump, MD;  Location: [facility];  Service: Cardiovascular;  Laterality: N/A;   LAMINECTOMY      Family History  Problem Relation Age of Onset   Heart attack Mother    Heart disease Mother    CAD Mother    Stroke Mother     Ovarian cancer Sister    Pancreatic cancer Brother     Social History:  reports that he quit smoking about 35 years ago. His smoking use included cigarettes. He has never used smokeless tobacco. He reports that he does not drink alcohol and does not use drugs.  Allergies:  Allergies  Allergen Reactions   Codeine Rash    Medications: I have reviewed the patient's current medications.  Results for orders placed or performed during the hospital encounter of 04/18/24 (from the past 48 hours)  Basic metabolic panel     Status: Abnormal   Collection Time: 04/18/24 12:50 PM  Result Value Ref Range   Sodium 137 135 - 145 mmol/L   Potassium 4.4 3.5 - 5.1 mmol/L   Chloride 103 98 - 111 mmol/L   CO2 23 22 - 32 mmol/L   Glucose, Bld 182 (H) 70 - 99 mg/dL    Comment: Glucose reference range applies only to samples taken after fasting for at least 8 hours.   BUN 19 8 - 23 mg/dL   Creatinine, Ser 1.61 0.61 - 1.24 mg/dL   Calcium  9.2 8.9 - 10.3 mg/dL   GFR, Estimated >09 >60 mL/min    Comment: (NOTE) Calculated using the CKD-EPI Creatinine Equation (2021)    Anion gap 11  5 - 15    Comment: Performed at [facility], [address], [place], [place] [address]  CBC     Status: Abnormal   Collection Time: 04/18/24 12:50 PM  Result Value Ref Range   WBC 15.0 (H) 4.0 - 10.5 K/uL   RBC 4.59 4.22 - 5.81 MIL/uL   Hemoglobin 13.5 13.0 - 17.0 g/dL   HCT 60.4 54.0 - 98.1 %   MCV 88.2 80.0 - 100.0 fL   MCH 29.4 26.0 - 34.0 pg   MCHC 33.3 30.0 - 36.0 g/dL   RDW 19.1 47.8 - 29.5 %   Platelets 256 150 - 400 K/uL   nRBC 0.0 0.0 - 0.2 %    Comment: Performed at [facility], [address], [place], [place] [address]    DG Wrist Complete Left Result Date: 04/18/2024 CLINICAL DATA:  evaluation for fracture EXAM: LEFT WRIST - COMPLETE 3+ VIEW COMPARISON:  None Available. FINDINGS: Osteopenia. Highly comminuted impacted fracture of the distal radius with multifocal intra-articular  extension. Foreshortening is estimated to span at least 20 mm. There is a comminuted fracture of the distal ulna with impaction and foreshortening. Probable widening of the scapholunate interval. Vascular calcifications. Soft tissue edema. IMPRESSION: 1. Highly comminuted impacted fracture of the distal radius with multifocal intra-articular extension. 2. Comminuted fracture of the distal ulna with impaction and foreshortening. 3. Probable widening of the scapholunate interval. Electronically Signed   By: Clancy Crimes M.D.   On: 04/18/2024 13:49    Pertinent items are noted in HPI. Temp:  [98.3 F (36.8 C)-98.4 F (36.9 C)] 98.3 F (36.8 C) (06/07 1849) Pulse Rate:  [72-91] 91 (06/07 1849) Resp:  [16-20] 16 (06/07 1849) BP: (142-165)/(75-92) 165/92 (06/07 1849) SpO2:  [92 %-96 %] 95 % (06/07 1849) General appearance: alert and cooperative Resp: clear to auscultation bilaterally Extremities: extremities normal, atraumatic, no cyanosis or edema Except for R wist with swelling, small abrasion, obvious deformity  Assessment: Complex R distal radius and distal unlar fractures Plan: Needs ORIF, hold blood thinner I have discussed this treatment plan in detail with patient and/or family, including the risks of the recommended treatment or surgery, the benefits and the alternatives.  The patient and/or caregiver understands that additional treatment may be necessary.  Duaine Radin C Maximino Cozzolino 04/18/2024, 7:06 PM

## 2024-04-18 NOTE — ED Triage Notes (Addendum)
 Pt BIB EMS, fell outside of his home, slipped, deformity to left wrist and pain to right knee. C/O chronic back pain. Takes oxy for   EMS gave 200 mcg fentanyl , 28 mg ketamine PTA   136/90 CBG 129

## 2024-04-18 NOTE — H&P (Signed)
 History and Physical    Patient: Raymond Powell DOB: 04-13-1956 DOA: 04/18/2024 DOS: the patient was seen and examined on 04/18/2024 PCP: Karena Ota, MD  Patient coming from: Home - lives with wife and [place]; [place]: Wife, Geno Sydnor, [phone]   Chief Complaint: Fall  HPI: Raymond Powell is a 68 y.o. male with medical history significant of afib, HTN, chronic pain, OSA, HLD, DM, and obesity who presented on 6/7 with a fall.  He reports that he was out in the yard today and slipped on the patio, falling onto his outstretched L hand.  He did not have dizziness prior, no LOC.  He did not hit his head.  He is mostly frustrated that this happened.  He takes Flecainide  and wants to make sure he gets tonight's dose.  Last dose of Eliquis  was this AM.  He has chronic back pain with R-sided radiculopathy for which he takes oxy 10 at home with reasonable control.  He is on CPAP for OSA.    ER Course:  Orthopedics issues, surgery tomorrow vs. Monday. Comminuted wrist fracture.  Eliquis  on hold, needs 24 hour washout.  Placing splints for now.     Review of Systems: As mentioned in the history of present illness. All other systems reviewed and are negative. 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  He also had nocturnal hypoxemia with O2 saturations less than 88% for 69 minutes. On CPAP at 13cm H2O   Complex sleep apnea syndrome    severe obstructive sleep apnea with an AHI of 53.7/h and mild central sleep apnea with central AHI of 9.8/h with 11% of the time in Cheyne-Stokes respirations.  On CPAP at 13cm H2O   GERD without esophagitis    Hyperlipidemia    Low back pain 04/16/2012   Lumbar post-laminectomy  syndrome 11/18/2017   Migraine    Neck pain 10/01/2012   Neuropathy of upper extremity 10/01/2012   Obesity    On long term drug therapy 11/18/2017   Pain of right shoulder joint on movement 11/21/2017   Piriformis syndrome 06/17/2012   Pneumonia    Sacroiliitis, not elsewhere classified (HCC) 04/16/2012   Shoulder pain 10/01/2012   Type 2 diabetes mellitus (HCC)    Past Surgical History:  Procedure Laterality Date   Arthroscopic knee surgery     ATRIAL FIBRILLATION ABLATION N/A 10/14/2020   Procedure: ATRIAL FIBRILLATION ABLATION;  Surgeon: Lei Pump, MD;  Location: [facility];  Service: Cardiovascular;  Laterality: N/A;   LAMINECTOMY     Social History:  reports that he quit smoking about 35 years ago. His smoking use included cigarettes. He has never used smokeless tobacco. He reports that he does not drink alcohol and does not use drugs.  Allergies  Allergen Reactions   Codeine Rash    Family History  Problem Relation Age of Onset   Heart attack Mother    Heart disease Mother    CAD Mother    Stroke Mother    Ovarian cancer Sister    Pancreatic cancer Brother     Prior to Admission medications   Medication Sig Start Date End Date Taking? Authorizing Provider  atenolol  (TENORMIN ) 50  MG tablet TAKE 1 TABLET BY MOUTH DAILY 12/02/23   Camnitz, Babetta Lesch, MD  atorvastatin  (LIPITOR ) 80 MG tablet Take 1 tablet by mouth at bedtime. 05/29/23   [provider]  ELIQUIS  5 MG TABS tablet TAKE 1 TABLET BY MOUTH TWICE  DAILY 01/22/24   Camnitz, Babetta Lesch, MD  flecainide  (TAMBOCOR ) 50 MG tablet TAKE ONE-HALF TABLET BY MOUTH IN THE MORNING AND 1 TABLET BY  MOUTH IN THE EVENING 10/28/23   Camnitz, Babetta Lesch, MD  gabapentin (NEURONTIN) 100 MG capsule Take 100 mg by mouth in the morning and at bedtime. 01/02/21   [provider]  lansoprazole (PREVACID) 15 MG capsule Take 1 capsule by mouth daily. 08/20/22   [provider]  lidocaine   (LIDODERM ) 5 % Place 1 patch onto the skin at bedtime as needed (left shoulder). Remove & Discard patch within 12 hours or as directed by MD    [provider]  lisinopril (ZESTRIL) 2.5 MG tablet Take 2.5 mg by mouth daily. 09/05/22   [provider]  meloxicam (MOBIC) 15 MG tablet Take 15 mg by mouth daily.     [provider]  oxyCODONE -acetaminophen  (PERCOCET) 10-325 MG tablet Take 1 tablet by mouth 5 (five) times daily as needed for pain.  01/25/20   [provider]    Physical Exam: Vitals:   04/18/24 1234 04/18/24 1533 04/18/24 1534  BP: (!) 142/75 (!) 149/81   Pulse: 72 79   Resp: 20 18   Temp:   98.4 F (36.9 C)  TempSrc:   Oral  SpO2: 92% 96%    General:  Appears calm and comfortable and is in NAD Eyes:  EOMI, normal lids, iris ENT:  grossly normal hearing, lips & tongue, mmm; appropriate dentition Cardiovascular:  RRR, no m/r/g. No LE edema.  Respiratory:   CTA bilaterally with no wheezes/rales/rhonchi.  Normal respiratory effort. Abdomen:  soft, NT, ND Skin:  L wrist abrasion Musculoskeletal:  L wrist deformity; R knee is stiff with movement but without apparent deformity or abrasion Psychiatric:  grossly normal mood and affect, speech fluent and appropriate, AOx3 Neurologic:  CN 2-12 grossly intact, moves all extremities in coordinated fashion   Radiological Exams on Admission: Independently reviewed - see discussion in A/P where applicable  DG Wrist Complete Left Result Date: 04/18/2024 CLINICAL DATA:  evaluation for fracture EXAM: LEFT WRIST - COMPLETE 3+ VIEW COMPARISON:  None Available. FINDINGS: Osteopenia. Highly comminuted impacted fracture of the distal radius with multifocal intra-articular extension. Foreshortening is estimated to span at least 20 mm. There is a comminuted fracture of the distal ulna with impaction and foreshortening. Probable widening of the scapholunate interval. Vascular calcifications. Soft tissue edema.  IMPRESSION: 1. Highly comminuted impacted fracture of the distal radius with multifocal intra-articular extension. 2. Comminuted fracture of the distal ulna with impaction and foreshortening. 3. Probable widening of the scapholunate interval. Electronically Signed   By: Clancy Crimes M.D.   On: 04/18/2024 13:49    EKG: pending   Labs on Admission: I have personally reviewed the available labs and imaging studies at the time of the admission.  Pertinent labs:    Glucose 182 WBC 15   Assessment and Plan: Principal Problem:   Displaced comminuted fracture of shaft of radius, right arm, initial encounter for closed fracture Active Problems:   Type 2 diabetes mellitus (HCC)   Obesity   Hypertension   Hyperlipidemia   GERD without esophagitis   Back pain   Atrial fibrillation (HCC)  Complex sleep apnea syndrome    L wrist fracture Highly comminuted impacted fracture of the distal radius and ulna Will need surgical repair after Eliquis  washout Dr. Jonna Netter will consult Will observe on med surg for now On chronic Percocet 10/325; will continue and add prn Dilaudid  for breakthrough pain  Afib Takes flecainide  for rate control Hold Eliquis   HTN Continue atenolol , lisinopril  HLD Continue atorvastatin   GERD Continue PPI  Chronic pain I have reviewed this patient in the [place] Controlled Substances Reporting System.  He is receiving medications from two providers but appears to be taking them as prescribed. He is not at particularly high risk of opioid misuse, diversion, or overdose.  Continue Percocet 10/325 and gabapentin  OSA Continue CPAP  DM No recent A1c Not on apparent medications for this Will check A1c and add moderate-scale SSI  Morbid (class 3) obesity Weight loss should be encouraged Outpatient PCP/bariatric medicine f/u encouraged Significantly low or high BMI is associated with higher medical risk including morbidity and mortality     Advance Care  Planning:   Code Status: Full Code - Code status was discussed with the patient and/or family at the time of admission.  The patient would want to receive full resuscitative measures at this time.   Consults: Orthopedics  DVT Prophylaxis: Heparin  SQ  Family Communication: Wife was present  Severity of Illness: The appropriate patient status for this patient is OBSERVATION. Observation status is judged to be reasonable and necessary in order to provide the required intensity of service to ensure the patient's safety. The patient's presenting symptoms, physical exam findings, and initial radiographic and laboratory data in the context of their medical condition is felt to place them at decreased risk for further clinical deterioration. Furthermore, it is anticipated that the patient will be medically stable for discharge from the hospital within 2 midnights of admission.   Author: Lorita Rosa, MD 04/18/2024 6:04 PM  For on call review [email or web address].

## 2024-04-18 NOTE — ED Provider Notes (Signed)
 [name] Provider Note   CSN: [id number] Arrival date & time: 04/18/24  1225     History  No chief complaint on file.   Raymond Powell is a 68 y.o. male presented to ED with complaint of left wrist pain.  Patient reports he lost his footing and fell backwards today and landed on his wrist, has deformity and pain of his wrist.  EMS give the patient a grams of fentanyl  and 28 mg of IV ketamine.  Patient is still requesting pain medicine  Patient reports he is on Eliquis .  Denies striking his head or loss of consciousness.  The patient is quite adamant that he does not have any new or worsening injury in his back, he has chronic back pain that is unchanged.  HPI     Home Medications Prior to Admission medications   Medication Sig Start Date End Date Taking? Authorizing Provider  atenolol  (TENORMIN ) 50 MG tablet TAKE 1 TABLET BY MOUTH DAILY 12/02/23   Camnitz, Babetta Lesch, MD  atorvastatin  (LIPITOR ) 80 MG tablet Take 1 tablet by mouth at bedtime. 05/29/23   [provider]  ELIQUIS  5 MG TABS tablet TAKE 1 TABLET BY MOUTH TWICE  DAILY 01/22/24   Camnitz, Babetta Lesch, MD  flecainide  (TAMBOCOR ) 50 MG tablet TAKE ONE-HALF TABLET BY MOUTH IN THE MORNING AND 1 TABLET BY  MOUTH IN THE EVENING 10/28/23   Camnitz, Babetta Lesch, MD  gabapentin (NEURONTIN) 100 MG capsule Take 100 mg by mouth in the morning and at bedtime. 01/02/21   [provider]  lansoprazole (PREVACID) 15 MG capsule Take 1 capsule by mouth daily. 08/20/22   [provider]  lansoprazole (PREVACID) 30 MG capsule Take 1 capsule by mouth daily. 03/30/24   [provider]  lidocaine  (LIDODERM ) 5 % Place 1 patch onto the skin at bedtime as needed (left shoulder). Remove & Discard patch within 12 hours or as directed by MD    [provider]  lisinopril (ZESTRIL) 2.5 MG tablet Take 2.5 mg by mouth daily. 09/05/22   [provider]   meloxicam (MOBIC) 15 MG tablet Take 15 mg by mouth daily.     [provider]  oxyCODONE -acetaminophen  (PERCOCET) 10-325 MG tablet Take 1 tablet by mouth 5 (five) times daily as needed for pain.  01/25/20   [provider]      Allergies    Codeine    Review of Systems   Review of Systems  Physical Exam Updated Vital Signs BP (!) 183/98   Pulse 98   Temp 99.1 F (37.3 C)   Resp 15   SpO2 94%  Physical Exam Constitutional:      General: He is not in acute distress. HENT:     Head: Normocephalic and atraumatic.  Eyes:     Conjunctiva/sclera: Conjunctivae normal.     Pupils: Pupils are equal, round, and reactive to light.  Cardiovascular:     Rate and Rhythm: Normal rate and regular rhythm.  Pulmonary:     Effort: Pulmonary effort is normal. No respiratory distress.  Abdominal:     General: There is no distension.     Tenderness: There is no abdominal tenderness.  Musculoskeletal:     Comments: Swelling and deformity of the left wrist, no discoloration of the fingers, cap refills noted, very small laceration underlying left wrist  Skin:    General: Skin is warm and dry.  Neurological:     General:  No focal deficit present.     Mental Status: He is alert. Mental status is at baseline.  Psychiatric:        Mood and Affect: Mood normal.        Behavior: Behavior normal.     ED Results / Procedures / Treatments   Labs (all labs ordered are listed, but only abnormal results are displayed) Labs Reviewed  BASIC METABOLIC PANEL WITH GFR - Abnormal; Notable for the following components:      Result Value   Glucose, Bld 182 (*)    All other components within normal limits  CBC - Abnormal; Notable for the following components:   WBC 15.0 (*)    All other components within normal limits  HIV ANTIBODY (ROUTINE TESTING W REFLEX)  CBC  BASIC METABOLIC PANEL WITH GFR  HEMOGLOBIN A1C    EKG None  Radiology DG Wrist Complete Left Result Date:  04/18/2024 CLINICAL DATA:  evaluation for fracture EXAM: LEFT WRIST - COMPLETE 3+ VIEW COMPARISON:  None Available. FINDINGS: Osteopenia. Highly comminuted impacted fracture of the distal radius with multifocal intra-articular extension. Foreshortening is estimated to span at least 20 mm. There is a comminuted fracture of the distal ulna with impaction and foreshortening. Probable widening of the scapholunate interval. Vascular calcifications. Soft tissue edema. IMPRESSION: 1. Highly comminuted impacted fracture of the distal radius with multifocal intra-articular extension. 2. Comminuted fracture of the distal ulna with impaction and foreshortening. 3. Probable widening of the scapholunate interval. Electronically Signed   By: Clancy Crimes M.D.   On: 04/18/2024 13:49    Procedures .Nerve Block  Date/Time: 04/18/2024 9:37 PM  Performed by: Arvilla Birmingham, MD Authorized by: Arvilla Birmingham, MD   Consent:    Consent obtained:  Verbal   Consent given by:  Patient   Risks, benefits, and alternatives were discussed: yes     Risks discussed:  Infection, nerve damage and unsuccessful block   Alternatives discussed:  Alternative treatment Universal protocol:    Procedure explained and questions answered to patient or proxy's satisfaction: yes     Relevant documents present and verified: yes     Test results available: yes     Imaging studies available: yes     Required blood products, implants, devices, and special equipment available: yes     Site/side marked: yes     Immediately prior to procedure, a time out was called: yes     Patient identity confirmed:  Arm band Indications:    Indications:  Pain relief Location:    Body area:  Upper extremity   Upper extremity nerve:  Radial   Laterality:  Left Pre-procedure details:    Skin preparation:  Alcohol   Preparation: Patient was prepped and draped in usual sterile fashion   Skin anesthesia:    Skin anesthesia method:   None Procedure details:    Block needle gauge:  22 G   Anesthetic injected:  Bupivacaine 0.5% w/o epi and lidocaine  1% w/o epi   Steroid injected:  None   Additive injected:  None   Injection procedure:  Incremental injection, introduced needle, negative aspiration for blood, anatomic landmarks identified and anatomic landmarks palpated   Paresthesia:  Immediately resolved and prolonged Post-procedure details:    Dressing:  None   Outcome:  Pain improved   Procedure completion:  Tolerated well, no immediate complications Comments:     Hematoma block left radius     Medications Ordered in ED Medications  HYDROmorphone  (DILAUDID ) injection  1 mg (1 mg Intravenous Given 04/18/24 1803)  atenolol  (TENORMIN ) tablet 50 mg (has no administration in time range)  atorvastatin  (LIPITOR ) tablet 80 mg (80 mg Oral Given 04/18/24 2027)  lisinopril (ZESTRIL) tablet 2.5 mg (has no administration in time range)  pantoprazole  (PROTONIX ) EC tablet 40 mg (has no administration in time range)  gabapentin (NEURONTIN) capsule 100 mg (100 mg Oral Patient Refused/Not Given 04/18/24 2012)  lidocaine  (LIDODERM ) 5 % 1 patch (has no administration in time range)  lactated ringers infusion (has no administration in time range)  acetaminophen  (TYLENOL ) tablet 650 mg (has no administration in time range)    Or  acetaminophen  (TYLENOL ) suppository 650 mg (has no administration in time range)  docusate sodium  (COLACE) capsule 100 mg (100 mg Oral Patient Refused/Not Given 04/18/24 2028)  polyethylene glycol (MIRALAX / GLYCOLAX) packet 17 g (has no administration in time range)  bisacodyl  (DULCOLAX) EC tablet 5 mg (has no administration in time range)  ondansetron  (ZOFRAN ) tablet 4 mg (has no administration in time range)    Or  ondansetron  (ZOFRAN ) injection 4 mg (has no administration in time range)  hydrALAZINE  (APRESOLINE ) injection 5 mg (has no administration in time range)  heparin  injection 5,000 Units (5,000 Units  Subcutaneous Given 04/18/24 2028)  insulin aspart (novoLOG) injection 0-15 Units (has no administration in time range)  flecainide  (TAMBOCOR ) tablet 50 mg (50 mg Oral Given 04/18/24 1902)    And  flecainide  (TAMBOCOR ) tablet 25 mg (has no administration in time range)  oxyCODONE -acetaminophen  (PERCOCET/ROXICET) 5-325 MG per tablet 1 tablet (1 tablet Oral Given 04/18/24 2028)    And  oxyCODONE  (Oxy IR/ROXICODONE ) immediate release tablet 5 mg (5 mg Oral Given 04/18/24 2028)  HYDROmorphone  (DILAUDID ) injection 1 mg (1 mg Intravenous Given 04/18/24 1320)  bupivacaine (PF) (MARCAINE) 0.25 % injection 10 mL (10 mLs Infiltration Given by Other 04/18/24 1647)  lidocaine  (PF) (XYLOCAINE ) 1 % injection 10 mL (10 mLs Other Given 04/18/24 1648)    ED Course/ Medical Decision Making/ A&P Clinical Course as of 04/18/24 2137  Sat Apr 18, 2024  1427 Patient follows with [facility] [MT]  1524 Pain improved with hematoma block  [MT]  1529 Paged dr Jonna Netter hand surgery, as dr Leighton Punches ([facility]) reports they are not taking hand call today for [facility] patients [MT]  1646 Called hand surgeon's office as initial number listed as pager is not a pager - their receptionist will page dr Jonna Netter [MT]  1652 Dr Jonna Netter recommending finger traps and volar splint and NPO at midnight, likely OR tomorrow or Monday [MT]  1731 I spoke to ortho tech - plan to finger trap reduction 10 lbs for 30-60 minutes and then volar splint. Pt reporting pain coming back, requesting pain meds [MT]    Clinical Course User Index [MT] Hailly Fess, Janalyn Me, MD                                 Medical Decision Making Amount and/or Complexity of Data Reviewed Labs: ordered. Radiology: ordered.  Risk Prescription drug management. Decision regarding hospitalization.   Patient is presenting to ED with complaint of left wrist pain after a fall at home.  This was mechanical fall.  He is quite adamant that there was no head injury, he has no headache, no  complaints of back pain.  He is complaining of right knee pain as well.  Supplemental history provided by EMS, gave the patient  ketamine and fentanyl  for pain en route to the hospital.  Patient appears to be neurovascularly intact on arrival, but there is concern for comminuted and displaced fracture of the distal radius and ulna on his x-ray imaging.  We were able to obtain some adequate pain relief with a hematoma block with lidocaine  bupivacaine.  IV pain meds also ordered  Initially I had ordered imaging of the patient's head and lumbar spine but the patient is adamant repeatedly, with his wife present, he does not have any worsening pain in his low back does not think there is a fracture there, and that he did not strike his head.  Therefore I discontinued this imaging.  His right knee may be a small traumatic hematoma or hemarthrosis, I have ordered x-ray of the knee.  I spoke to the consultant for hand surgery, Dr. Jonna Netter, recommending finger traps and short-arm splinting (sugar tong okay), NPO at midnight, holding eliquis  at this time.  Possible OR tomorrow or Monday.  Patient admitted to hospitalist service.          Final Clinical Impression(s) / ED Diagnoses Final diagnoses:  Closed fracture of distal end of left radius, unspecified fracture morphology, initial encounter    Rx / DC Orders ED Discharge Orders     None         Arvilla Birmingham, MD 04/18/24 2139

## 2024-04-18 NOTE — Progress Notes (Signed)
 Orthopedic Tech Progress Note Patient Details:  Raymond Powell 1956-11-08 [id number]  Finger traps applied with 10lbs of traction to the LUE for 30 mins per Dr. Gordon Latus. A sugar tong splint was then placed to the LUE. Motion and sensation of his fingers remain intact. Ice and elevation were encouraged.  Ortho Devices Type of Ortho Device: Finger trap, Sugartong splint Finger Trap Weight: 10 Ortho Device/Splint Location: LUE Ortho Device/Splint Interventions: Ordered, Application, Adjustment   Post Interventions Patient Tolerated: Fair Instructions Provided: Care of device  Paralee Pendergrass Crystal Dory 04/18/2024, 6:11 PM

## 2024-04-19 ENCOUNTER — Other Ambulatory Visit: Payer: Self-pay

## 2024-04-19 ENCOUNTER — Observation Stay (HOSPITAL_BASED_OUTPATIENT_CLINIC_OR_DEPARTMENT_OTHER): Admitting: Anesthesiology

## 2024-04-19 ENCOUNTER — Observation Stay (HOSPITAL_COMMUNITY): Admitting: Anesthesiology

## 2024-04-19 ENCOUNTER — Encounter (HOSPITAL_COMMUNITY)
Admission: EM | Disposition: A | Discharge status: Home / Self Care | Payer: Self-pay | Source: Home / Self Care | Attending: Emergency Medicine

## 2024-04-19 ENCOUNTER — Encounter (HOSPITAL_COMMUNITY): Payer: Self-pay | Admitting: Internal Medicine

## 2024-04-19 ENCOUNTER — Observation Stay (HOSPITAL_COMMUNITY)

## 2024-04-19 DIAGNOSIS — S62102A Fracture of unspecified carpal bone, left wrist, initial encounter for closed fracture: Secondary | ICD-10-CM

## 2024-04-19 DIAGNOSIS — Z87891 Personal history of nicotine dependence: Secondary | ICD-10-CM

## 2024-04-19 DIAGNOSIS — S52351A Displaced comminuted fracture of shaft of radius, right arm, initial encounter for closed fracture: Secondary | ICD-10-CM | POA: Diagnosis not present

## 2024-04-19 DIAGNOSIS — G4733 Obstructive sleep apnea (adult) (pediatric): Secondary | ICD-10-CM | POA: Diagnosis not present

## 2024-04-19 DIAGNOSIS — I10 Essential (primary) hypertension: Secondary | ICD-10-CM | POA: Diagnosis not present

## 2024-04-19 HISTORY — PX: ORIF RADIAL FRACTURE: SHX5113

## 2024-04-19 LAB — SURGICAL PCR SCREEN
MRSA, PCR: NEGATIVE
Staphylococcus aureus: NEGATIVE

## 2024-04-19 LAB — CBC
HCT: 42 % (ref 39.0–52.0)
Hemoglobin: 14.1 g/dL (ref 13.0–17.0)
MCH: 29.1 pg (ref 26.0–34.0)
MCHC: 33.6 g/dL (ref 30.0–36.0)
MCV: 86.6 fL (ref 80.0–100.0)
Platelets: 270 10*3/uL (ref 150–400)
RBC: 4.85 MIL/uL (ref 4.22–5.81)
RDW: 12.8 % (ref 11.5–15.5)
WBC: 12.6 10*3/uL — ABNORMAL HIGH (ref 4.0–10.5)
nRBC: 0 % (ref 0.0–0.2)

## 2024-04-19 LAB — BASIC METABOLIC PANEL WITH GFR
Anion gap: 9 (ref 5–15)
BUN: 13 mg/dL (ref 8–23)
CO2: 23 mmol/L (ref 22–32)
Calcium: 9 mg/dL (ref 8.9–10.3)
Chloride: 103 mmol/L (ref 98–111)
Creatinine, Ser: 0.83 mg/dL (ref 0.61–1.24)
GFR, Estimated: >60 mL/min (ref >60)
Glucose, Bld: 147 mg/dL — ABNORMAL HIGH (ref 70–99)
Potassium: 3.9 mmol/L (ref 3.5–5.1)
Sodium: 135 mmol/L (ref 135–145)

## 2024-04-19 LAB — GLUCOSE, CAPILLARY
Glucose-Capillary: 164 mg/dL — ABNORMAL HIGH (ref 70–99)
Glucose-Capillary: 168 mg/dL — ABNORMAL HIGH (ref 70–99)
Glucose-Capillary: 183 mg/dL — ABNORMAL HIGH (ref 70–99)

## 2024-04-19 SURGERY — OPEN REDUCTION INTERNAL FIXATION (ORIF) RADIAL FRACTURE
Anesthesia: General | Laterality: Left

## 2024-04-19 MED ORDER — ONDANSETRON HCL 4 MG/2ML IJ SOLN
INTRAMUSCULAR | Status: AC
Start: 2024-04-19 — End: ?
  Filled 2024-04-19: qty 2

## 2024-04-19 MED ORDER — MIDAZOLAM HCL 2 MG/2ML IJ SOLN
INTRAMUSCULAR | Status: AC
Start: 2024-04-19 — End: ?
  Filled 2024-04-19: qty 2

## 2024-04-19 MED ORDER — CHLORHEXIDINE GLUCONATE 0.12 % MT SOLN
15.0000 mL | Freq: Once | OROMUCOSAL | Status: AC
  Administered 2024-04-19: 15 mL via OROMUCOSAL

## 2024-04-19 MED ORDER — BUPIVACAINE HCL (PF) 0.5 % IJ SOLN
INTRAMUSCULAR | Status: DC | PRN
  Administered 2024-04-19: 30 mL via PERINEURAL

## 2024-04-19 MED ORDER — FENTANYL CITRATE (PF) 100 MCG/2ML IJ SOLN
INTRAMUSCULAR | Status: AC
  Filled 2024-04-19: qty 2

## 2024-04-19 MED ORDER — MIDAZOLAM HCL 2 MG/2ML IJ SOLN
INTRAMUSCULAR | Status: DC | PRN
  Administered 2024-04-19: 2 mg via INTRAVENOUS

## 2024-04-19 MED ORDER — DEXAMETHASONE SODIUM PHOSPHATE 10 MG/ML IJ SOLN
INTRAMUSCULAR | Status: AC
  Filled 2024-04-19: qty 1

## 2024-04-19 MED ORDER — PROPOFOL 10 MG/ML IV BOLUS
INTRAVENOUS | Status: AC
  Filled 2024-04-19: qty 20

## 2024-04-19 MED ORDER — INSULIN ASPART 100 UNIT/ML IJ SOLN: 0.0000 [IU] | INTRAMUSCULAR | Status: DC | PRN

## 2024-04-19 MED ORDER — CEPHALEXIN 750 MG PO CAPS
750.0000 mg | ORAL_CAPSULE | Freq: Four times a day (QID) | ORAL | 0 refills | Status: AC
Start: 2024-04-19 — End: 2024-04-26

## 2024-04-19 MED ORDER — PROPOFOL 10 MG/ML IV BOLUS
INTRAVENOUS | Status: DC | PRN
  Administered 2024-04-19: 150 mg via INTRAVENOUS

## 2024-04-19 MED ORDER — CEFAZOLIN SODIUM 1 G IJ SOLR
INTRAMUSCULAR | Status: AC
  Filled 2024-04-19: qty 30

## 2024-04-19 MED ORDER — ONDANSETRON HCL 4 MG/2ML IJ SOLN: 4.0000 mg | Freq: Four times a day (QID) | INTRAMUSCULAR | Status: DC | PRN

## 2024-04-19 MED ORDER — FENTANYL CITRATE (PF) 100 MCG/2ML IJ SOLN
25.0000 ug | INTRAMUSCULAR | Status: DC | PRN
  Administered 2024-04-19: 25 ug via INTRAVENOUS
  Administered 2024-04-19: 50 ug via INTRAVENOUS
  Administered 2024-04-19: 25 ug via INTRAVENOUS

## 2024-04-19 MED ORDER — ONDANSETRON HCL 4 MG/2ML IJ SOLN
INTRAMUSCULAR | Status: DC | PRN
  Administered 2024-04-19: 4 mg via INTRAVENOUS

## 2024-04-19 MED ORDER — 0.9 % SODIUM CHLORIDE (POUR BTL) OPTIME
TOPICAL | Status: DC | PRN
  Administered 2024-04-19: 1000 mL

## 2024-04-19 MED ORDER — OXYCODONE HCL 5 MG PO TABS
5.0000 mg | ORAL_TABLET | Freq: Once | ORAL | Status: AC | PRN
  Administered 2024-04-19: 5 mg via ORAL

## 2024-04-19 MED ORDER — CHLORHEXIDINE GLUCONATE 0.12 % MT SOLN
OROMUCOSAL | Status: AC
  Filled 2024-04-19: qty 15

## 2024-04-19 MED ORDER — DEXAMETHASONE SODIUM PHOSPHATE 10 MG/ML IJ SOLN
INTRAMUSCULAR | Status: DC | PRN
  Administered 2024-04-19: 5 mg via INTRAVENOUS

## 2024-04-19 MED ORDER — OXYCODONE HCL 5 MG PO TABS
ORAL_TABLET | ORAL | Status: AC
  Filled 2024-04-19: qty 1

## 2024-04-19 MED ORDER — DEXTROSE 5 % IV SOLN
INTRAVENOUS | Status: DC | PRN
  Administered 2024-04-19: 3 g via INTRAVENOUS

## 2024-04-19 MED ORDER — OXYCODONE HCL 5 MG/5ML PO SOLN: 5.0000 mg | Freq: Once | ORAL | Status: AC | PRN

## 2024-04-19 MED ORDER — FENTANYL CITRATE (PF) 250 MCG/5ML IJ SOLN
INTRAMUSCULAR | Status: AC
  Filled 2024-04-19: qty 5

## 2024-04-19 MED ORDER — ORAL CARE MOUTH RINSE: 15.0000 mL | Freq: Once | OROMUCOSAL | Status: AC

## 2024-04-19 MED ORDER — FENTANYL CITRATE (PF) 250 MCG/5ML IJ SOLN
INTRAMUSCULAR | Status: DC | PRN
  Administered 2024-04-19: 50 ug via INTRAVENOUS
  Administered 2024-04-19: 25 ug via INTRAVENOUS

## 2024-04-19 MED ORDER — LACTATED RINGERS IV SOLN: INTRAVENOUS | Status: DC

## 2024-04-19 SURGICAL SUPPLY — 54 items
BAG COUNTER SPONGE SURGICOUNT (BAG) ×1 IMPLANT
BIT DRILL 2.0 LNG QUCK RELEASE (BIT) IMPLANT
BIT DRILL QC 2.8X5 (BIT) IMPLANT
BNDG COMPR ESMARK 4X3 LF (GAUZE/BANDAGES/DRESSINGS) IMPLANT
BNDG ELASTIC 3INX 5YD STR LF (GAUZE/BANDAGES/DRESSINGS) ×1 IMPLANT
BNDG ELASTIC 4INX 5YD STR LF (GAUZE/BANDAGES/DRESSINGS) IMPLANT
CANISTER SUCTION 3000ML PPV (SUCTIONS) ×1 IMPLANT
CHLORAPREP W/TINT 26 (MISCELLANEOUS) ×1 IMPLANT
CORD BIPOLAR FORCEPS 12FT (ELECTRODE) ×1 IMPLANT
COVER SURGICAL LIGHT HANDLE (MISCELLANEOUS) ×1 IMPLANT
CUFF TOURN SGL QUICK 18X4 (TOURNIQUET CUFF) ×1 IMPLANT
CUFF TRNQT CYL 24X4X16.5-23 (TOURNIQUET CUFF) IMPLANT
DRAIN TLS ROUND 10FR (DRAIN) IMPLANT
DRAPE OEC MINIVIEW 54X84 (DRAPES) ×1 IMPLANT
DRAPE SURG 17X23 STRL (DRAPES) ×1 IMPLANT
GAUZE SPONGE 4X4 12PLY STRL (GAUZE/BANDAGES/DRESSINGS) IMPLANT
GAUZE XEROFORM 1X8 LF (GAUZE/BANDAGES/DRESSINGS) ×1 IMPLANT
GLOVE BIOGEL M 8.0 STRL (GLOVE) ×1 IMPLANT
GOWN STRL REUS W/ TWL XL LVL3 (GOWN DISPOSABLE) ×2 IMPLANT
GUIDEWIRE ORTHO 0.054X6 (WIRE) IMPLANT
HIBICLENS CHG 4% 4OZ BTL (MISCELLANEOUS) ×1 IMPLANT
KIT BASIN OR (CUSTOM PROCEDURE TRAY) ×1 IMPLANT
NDL HYPO 22X1.5 SAFETY MO (MISCELLANEOUS) IMPLANT
NEEDLE HYPO 22X1.5 SAFETY MO (MISCELLANEOUS) IMPLANT
NS IRRIG 1000ML POUR BTL (IV SOLUTION) ×1 IMPLANT
PACK ORTHO EXTREMITY (CUSTOM PROCEDURE TRAY) ×1 IMPLANT
PAD CAST 3X4 CTTN HI CHSV (CAST SUPPLIES) IMPLANT
PAD CAST 4YDX4 CTTN HI CHSV (CAST SUPPLIES) IMPLANT
PADDING CAST ABS COTTON 4X4 ST (CAST SUPPLIES) ×1 IMPLANT
PLATE STD RT ACULOC 2 (Plate) IMPLANT
PLATE VOLAR DIST ULNA (Plate) IMPLANT
SCREW BN FT 16X2.3XLCK HEX CRT (Screw) IMPLANT
SCREW CORT FT 20X2.3XLCK HEX (Screw) IMPLANT
SCREW CORT FT 22X2.3XLCK HEX (Screw) IMPLANT
SCREW CORT FT 24X2.3XLCK HEX (Screw) IMPLANT
SCREW CORTICAL LOCKING 2.3X14M (Screw) IMPLANT
SCREW HEXALOBE NON-LOCK 3.5X14 (Screw) IMPLANT
SCREW NON LOCK 3.5X10MM (Screw) IMPLANT
SCREW NON TOGG 2.3X16MM (Screw) IMPLANT
SCREW NON TOGG 2.3X18MM (Screw) IMPLANT
SCREW VA LKG 2.3X16MM (Screw) IMPLANT
SPLINT PLASTER CAST XFAST 4X15 (CAST SUPPLIES) IMPLANT
SUT ETHILON 3 0 PS 1 (SUTURE) ×1 IMPLANT
SUT ETHILON 4 0 PS 2 18 (SUTURE) ×1 IMPLANT
SUT MNCRL AB 4-0 PS2 18 (SUTURE) IMPLANT
SUT VIC AB 3-0 PS1 18XBRD (SUTURE) ×1 IMPLANT
SUT VIC AB 3-0 SH 27X BRD (SUTURE) IMPLANT
SUT VIC AB 4-0 PS2 18 (SUTURE) ×1 IMPLANT
SYR BULB EAR ULCER 3OZ GRN STR (SYRINGE) ×1 IMPLANT
SYR CONTROL 10ML LL (SYRINGE) IMPLANT
TOWEL GREEN STERILE FF (TOWEL DISPOSABLE) ×1 IMPLANT
TUBE CONNECTING 20X1/4 (TUBING) ×1 IMPLANT
UNDERPAD 30X36 HEAVY ABSORB (UNDERPADS AND DIAPERS) ×1 IMPLANT
WATER STERILE IRR 1000ML POUR (IV SOLUTION) ×1 IMPLANT

## 2024-04-19 NOTE — Anesthesia Procedure Notes (Signed)
 Anesthesia Regional Block: Supraclavicular block   Pre-Anesthetic Checklist: , timeout performed,  Correct Patient, Correct Site, Correct Laterality,  Correct Procedure, Correct Position, site marked,  Risks and benefits discussed,  Surgical consent,  Pre-op evaluation,  At surgeon's request and post-op pain management  Laterality: Left  Prep: chloraprep       Needles:  Injection technique: Single-shot  Needle Type: Echogenic Stimulator Needle     Needle Length: 5cm  Needle Gauge: 22     Additional Needles:   Procedures:, nerve stimulator,,,,,     Nerve Stimulator or Paresthesia:  Response: biceps flexion, 0.45 mA  Additional Responses:   Narrative:  Start time: 04/19/2024 7:52 AM End time: 04/19/2024 8:00 AM Injection made incrementally with aspirations every 5 mL.  Performed by: Personally  Anesthesiologist: Ellena Gurney, MD  Additional Notes: Functioning IV was confirmed and monitors were applied.  A 50mm 22ga Arrow echogenic stimulator needle was used. Sterile prep and drape,hand hygiene and sterile gloves were used.  Negative aspiration and negative test dose prior to incremental administration of local anesthetic. The patient tolerated the procedure well.  Ultrasound guidance: relevent anatomy identified, needle position confirmed, local anesthetic spread visualized around nerve(s), vascular puncture avoided.  Image printed for medical record.

## 2024-04-19 NOTE — Plan of Care (Signed)
  Problem: Education: Goal: Knowledge of General Education information will improve Description: Including pain rating scale, medication(s)/side effects and non-pharmacologic comfort measures Outcome: Progressing   Problem: Pain Managment: Goal: General experience of comfort will improve and/or be controlled Outcome: Progressing

## 2024-04-19 NOTE — Progress Notes (Signed)
 Orthopedic Tech Progress Note Patient Details:  Raymond Powell 07-29-1956 [id number]  Ortho Devices Type of Ortho Device: Arm sling Finger Trap Weight: 10 Ortho Device/Splint Location: LUE Ortho Device/Splint Interventions: Ordered, Application, Adjustment   Post Interventions Patient Tolerated: Well Instructions Provided: Care of device, Adjustment of device  Willean Schurman Crystal Dory 04/19/2024, 4:00 PM

## 2024-04-19 NOTE — Transfer of Care (Signed)
 Immediate Anesthesia Transfer of Care Note  Patient: ADRIN Powell  Procedure(s) Performed: OPEN REDUCTION INTERNAL FIXATION (ORIF) RADIAL FRACTURE (Left)  Patient Location: PACU  Anesthesia Type:GA combined with regional for post-op pain  Level of Consciousness: awake, alert , and oriented  Airway & Oxygen  Therapy: Patient Spontanous Breathing  Post-op Assessment: Report given to RN and Post -op Vital signs reviewed and stable  Post vital signs: Reviewed and stable  Last Vitals:  Vitals Value Taken Time  BP 137/73 04/19/24 1007  Temp    Pulse 107 04/19/24 1009  Resp 21 04/19/24 1009  SpO2 91 % 04/19/24 1009  Vitals shown include unfiled device data.  Last Pain:  Vitals:   04/19/24 0719  TempSrc: Oral  PainSc:          Complications: No notable events documented.

## 2024-04-19 NOTE — Discharge Summary (Signed)
 Physician Discharge Summary   Patient: Raymond Powell MRN: [id number] DOB: 1956/07/16  Admit date:     04/18/2024  Discharge date: 04/19/24  Discharge Physician: Lorita Rosa   PCP: Karena Ota, MD   Recommendations at discharge:   Follow up with Dr. Jonna Netter in 2 weeks; call for an appointment Follow up with Dr. Vallerie Gave in 1-2 weeks Take antibiotic as directed (cephalexin/Keflex 4 times daily x 7 days) Non-weight bearing on L hand Keep elevated as much as possible Resume Eliquis  on 6/9  Discharge Diagnoses: Principal Problem:   Displaced comminuted fracture of shaft of radius, right arm, initial encounter for closed fracture Active Problems:   Type 2 diabetes mellitus (HCC)   Obesity   Hypertension   Hyperlipidemia   GERD without esophagitis   Back pain   Atrial fibrillation (HCC)   Complex sleep apnea syndrome    Hospital Course: 68yo with h/o  afib, HTN, chronic pain, OSA, HLD, DM, and obesity who presented on 6/7 with a mechanical fall, landing on an outstretched hand.  Found to have a comminuted impacted fracture of the distal radius and ulna, surgical repair with Dr. Jonna Netter on 6/8.  Assessment and Plan:  L wrist fracture Highly comminuted impacted fracture of the distal radius and ulna Dr. Jonna Netter consulted Observed on med surg  On chronic Percocet 10/325; will continue  Underwent surgical repair (ORIF) on 6/8 after Eliquis  washout Follow up with Dr. Jonna Netter in 2 weeks Keflex x 1 week Non-weight bearing Left hand, elevate    Afib Takes flecainide  for rate control Resume Eliquis  tomorrow   HTN Continue atenolol , lisinopril   HLD Continue atorvastatin    GERD Continue PPI   Chronic pain I have reviewed this patient in the [place] Controlled Substances Reporting System.  He is receiving medications from two providers but appears to be taking them as prescribed. He is not at particularly high risk of opioid misuse, diversion, or overdose.  Continue Percocet  10/325 and gabapentin   OSA Continue CPAP   DM No recent A1c, ordered and pending at time of dc Not on apparent medications for this F/u with PCP   Morbid (class 3) obesity Weight loss should be encouraged Outpatient PCP/bariatric medicine f/u encouraged Significantly low or high BMI is associated with higher medical risk including morbidity and mortality       Consultants: Orthopedics/hand surgery  Procedures: ORIF radial fracture 6/8  Antibiotics: Cefazolin x 1     Pain control - [place]  Controlled Substance Reporting System database was reviewed. and patient was instructed, not to drive, operate heavy machinery, perform activities at heights, swimming or participation in water activities or provide baby-sitting services while on Pain, Sleep and Anxiety Medications; until their outpatient Physician has advised to do so again. Also recommended to not to take more than prescribed Pain, Sleep and Anxiety Medications.   Disposition: Home Diet recommendation:  Carb modified diet DISCHARGE MEDICATION: Allergies as of 04/19/2024       Reactions   Codeine Rash        Medication List     PAUSE taking these medications    Eliquis  5 MG Tabs tablet Wait to take this until: April 20, 2024 Generic drug: apixaban  TAKE 1 TABLET BY MOUTH TWICE  DAILY       TAKE these medications    atenolol  50 MG tablet Commonly known as: TENORMIN  TAKE 1 TABLET BY MOUTH DAILY   atorvastatin  80 MG tablet Commonly known as: LIPITOR  Take 1 tablet by  mouth at bedtime.   cephALEXin 750 MG capsule Commonly known as: Keflex Take 1 capsule (750 mg total) by mouth 4 (four) times daily for 7 days.   flecainide  50 MG tablet Commonly known as: TAMBOCOR  TAKE ONE-HALF TABLET BY MOUTH IN THE MORNING AND 1 TABLET BY  MOUTH IN THE EVENING   gabapentin 100 MG capsule Commonly known as: NEURONTIN Take 100 mg by mouth in the morning and at bedtime.   lansoprazole 30 MG capsule Commonly  known as: PREVACID Take 1 capsule by mouth daily. What changed: Another medication with the same name was removed. Continue taking this medication, and follow the directions you see here.   lidocaine  5 % Commonly known as: LIDODERM  Place 1 patch onto the skin at bedtime as needed (left shoulder). Remove & Discard patch within 12 hours or as directed by MD   lisinopril 2.5 MG tablet Commonly known as: ZESTRIL Take 2.5 mg by mouth daily.   meloxicam 15 MG tablet Commonly known as: MOBIC Take 15 mg by mouth daily.   oxyCODONE -acetaminophen  10-325 MG tablet Commonly known as: PERCOCET Take 1 tablet by mouth 5 (five) times daily as needed for pain.        Follow-up Information     Mauricia South, MD Follow up in 2 week(s).   Specialty: General Surgery Contact information: [address]  [address] [place] [address] [phone]                Discharge Exam: Cleavon Curls Weights   04/18/24 2337 04/19/24 0719  Weight: 136 kg 136 kg     Subjective: Seen post-opeartively, doing ok.  No specific concerns.   Objective: Vitals:   04/19/24 1116 04/19/24 1408  BP: 134/81 130/77  Pulse: 95 83  Resp:    Temp: 97.8 F (36.6 C) 98.2 F (36.8 C)  SpO2: 95% 93%    Intake/Output Summary (Last 24 hours) at 04/19/2024 1428 Last data filed at 04/19/2024 1256 Gross per 24 hour  Intake 1133.62 ml  Output 1025 ml  Net 108.62 ml   Filed Weights   04/18/24 2337 04/19/24 0719  Weight: 136 kg 136 kg    Exam:  General:  Appears calm and comfortable and is in NAD Eyes:  EOMI, normal lids, iris ENT:  grossly normal hearing, lips & tongue, mmm; appropriate dentition Cardiovascular:  RRR, no m/r/g. No LE edema.  Respiratory:   CTA bilaterally with no wheezes/rales/rhonchi.  Normal respiratory effort. Abdomen:  soft, NT, ND Skin:  no rashes noted on brief exam Musculoskeletal:  L wrist is splinted; R knee with effusion but no pain, no abrasion, no deformity Psychiatric:   grossly normal mood and affect, speech fluent and appropriate, AOx3 Neurologic:  CN 2-12 grossly intact, moves all extremities in coordinated fashion   Data Reviewed: I have reviewed the patient's lab results since admission.  Pertinent labs for today include:   Glucose 147 WBC 12.6, improved from 15    Condition at discharge: stable  The results of significant diagnostics from this hospitalization (including imaging, microbiology, ancillary and laboratory) are listed below for reference.   Imaging Studies: DG MINI C-ARM IMAGE ONLY Result Date: 04/19/2024 There is no interpretation for this exam.  This order is for images obtained during a surgical procedure.  Please See "Surgeries" Tab for more information regarding the procedure.   DG Wrist Complete Left Result Date: 04/18/2024 CLINICAL DATA:  evaluation for fracture EXAM: LEFT WRIST - COMPLETE 3+ VIEW COMPARISON:  None Available.  FINDINGS: Osteopenia. Highly comminuted impacted fracture of the distal radius with multifocal intra-articular extension. Foreshortening is estimated to span at least 20 mm. There is a comminuted fracture of the distal ulna with impaction and foreshortening. Probable widening of the scapholunate interval. Vascular calcifications. Soft tissue edema. IMPRESSION: 1. Highly comminuted impacted fracture of the distal radius with multifocal intra-articular extension. 2. Comminuted fracture of the distal ulna with impaction and foreshortening. 3. Probable widening of the scapholunate interval. Electronically Signed   By: Clancy Crimes M.D.   On: 04/18/2024 13:49    Microbiology: Results for orders placed or performed during the hospital encounter of 04/18/24  Surgical pcr screen     Status: None   Collection Time: 04/19/24  1:36 AM   Specimen: Nasal Mucosa; Nasal Swab  Result Value Ref Range Status   MRSA, PCR NEGATIVE NEGATIVE Final   Staphylococcus aureus NEGATIVE NEGATIVE Final    Comment: (NOTE) The Xpert  SA Assay (FDA approved for NASAL specimens in patients 43 years of age and older), is one component of a comprehensive surveillance program. It is not intended to diagnose infection nor to guide or monitor treatment. Performed at [facility], [address] [place], [place] [address]     Labs: CBC: Recent Labs  Lab 04/18/24 1250 04/19/24 0513  WBC 15.0* 12.6*  HGB 13.5 14.1  HCT 40.5 42.0  MCV 88.2 86.6  PLT 256 270   Basic Metabolic Panel: Recent Labs  Lab 04/18/24 1250 04/19/24 0513  NA 137 135  K 4.4 3.9  CL 103 103  CO2 23 23  GLUCOSE 182* 147*  BUN 19 13  CREATININE 0.95 0.83  CALCIUM  9.2 9.0   Liver Function Tests: No results for input(s): "AST", "ALT", "ALKPHOS", "BILITOT", "PROT", "ALBUMIN" in the last 168 hours. CBG: Recent Labs  Lab 04/18/24 2136 04/18/24 2355 04/19/24 0632 04/19/24 1009 04/19/24 1117  GLUCAP 192* 154* 164* 168* 183*    Discharge time spent: greater than 30 minutes.  Signed: Lorita Rosa, MD [facility] 04/19/2024

## 2024-04-19 NOTE — Progress Notes (Signed)
 I have seen and examined the patient; all questions answered; will proceed with procedure of left wrist as planned.

## 2024-04-19 NOTE — Anesthesia Preprocedure Evaluation (Signed)
 Anesthesia Evaluation  Patient identified by MRN, date of birth, ID band Patient awake    Reviewed: Allergy & Precautions, H&P , NPO status , Patient's Chart, lab work & pertinent test results  Airway Mallampati: II   Neck ROM: full    Dental   Pulmonary sleep apnea and Continuous Positive Airway Pressure Ventilation , former smoker   breath sounds clear to auscultation       Cardiovascular hypertension, + dysrhythmias Atrial Fibrillation  Rhythm:regular Rate:Normal     Neuro/Psych  Headaches    GI/Hepatic ,GERD  ,,  Endo/Other  diabetes, Type 2  Class 3 obesity  Renal/GU      Musculoskeletal   Abdominal   Peds  Hematology   Anesthesia Other Findings   Reproductive/Obstetrics                             Anesthesia Physical Anesthesia Plan  ASA: 3  Anesthesia Plan: General   Post-op Pain Management: Regional block*   Induction: Intravenous  PONV Risk Score and Plan: 2 and Ondansetron , Dexamethasone , Midazolam  and Treatment may vary due to age or medical condition  Airway Management Planned: LMA  Additional Equipment:   Intra-op Plan:   Post-operative Plan: Extubation in OR  Informed Consent: I have reviewed the patients History and Physical, chart, labs and discussed the procedure including the risks, benefits and alternatives for the proposed anesthesia with the patient or authorized representative who has indicated his/her understanding and acceptance.     Dental advisory given  Plan Discussed with: CRNA, Anesthesiologist and Surgeon  Anesthesia Plan Comments:        Anesthesia Quick Evaluation

## 2024-04-19 NOTE — Hospital Course (Signed)
 68yo with h/o  afib, HTN, chronic pain, OSA, HLD, DM, and obesity who presented on 6/7 with a mechanical fall, landing on an outstretched hand.  Found to have a comminuted impacted fracture of the distal radius and ulna, surgical repair with Dr. Jonna Netter on 6/8.

## 2024-04-19 NOTE — Discharge Instructions (Signed)
 Discharge Instructions:  Keep your dressing clean, dry and in place until instructed to remove by Dr. Izora Ribas.  If the dressing becomes dirty or wet call the office for instructions during business hours. Elevate the extremity to help with swelling, this will also help with any discomfort. Take your medication as prescribed. No lifting with the injured  extremity. If you feel that the dressing is too tight, you may loosen it, but keep it on; finger tips should be pink; if there is a concern, call the office. [phone] Ice may be used if the injury is a fracture, do not apply ice directly to the skin. Please call the office on the next business day after discharge to arrange a follow up appointment.  Call [phone] between the hours of 9am - 5pm M-Th or 9am - 1pm on Fri. For most hand injuries and/or conditions, you may return to work using the uninjured hand (one handed duty) within 24-72 hours.  A detailed note will be provided to you at your follow up appointment or may contact the office prior to your follow up.

## 2024-04-19 NOTE — Care Management Obs Status (Signed)
 MEDICARE OBSERVATION STATUS NOTIFICATION   Patient Details  Name: Raymond Powell MRN: [id number] Date of Birth: [birth year]   Medicare Observation Status Notification Given:  Yes    Jannine Meo, RN 04/19/2024, 2:34 PM

## 2024-04-19 NOTE — Anesthesia Procedure Notes (Signed)
 Procedure Name: LMA Insertion Date/Time: 04/19/2024 8:22 AM  Performed by: Laroy Plunk, CRNAPre-anesthesia Checklist: Patient identified, Emergency Drugs available, Suction available and Patient being monitored Patient Re-evaluated:Patient Re-evaluated prior to induction Oxygen  Delivery Method: Circle System Utilized Preoxygenation: Pre-oxygenation with 100% oxygen  Induction Type: IV induction Ventilation: Mask ventilation without difficulty LMA: LMA inserted LMA Size: 5.0 Number of attempts: 1 Placement Confirmation: positive ETCO2 Tube secured with: Tape Dental Injury: Teeth and Oropharynx as per pre-operative assessment

## 2024-04-19 NOTE — Progress Notes (Signed)
 Patient and spouse verbalized understanding of dc isntructions. All belongings given to patient. Ortho tech educated patient on sling care/use.

## 2024-04-20 ENCOUNTER — Encounter (HOSPITAL_COMMUNITY): Payer: Self-pay | Admitting: General Surgery

## 2024-04-20 LAB — HEMOGLOBIN A1C
Hgb A1c MFr Bld: 6.9 % — ABNORMAL HIGH (ref 4.8–5.6)
Mean Plasma Glucose: 151 mg/dL

## 2024-04-21 NOTE — Anesthesia Postprocedure Evaluation (Signed)
 Anesthesia Post Note  Patient: Raymond Powell  Procedure(s) Performed: OPEN REDUCTION INTERNAL FIXATION (ORIF) RADIAL FRACTURE (Left)     Patient location during evaluation: PACU Anesthesia Type: General and Regional Level of consciousness: awake and alert Pain management: pain level controlled Vital Signs Assessment: post-procedure vital signs reviewed and stable Respiratory status: spontaneous breathing, nonlabored ventilation, respiratory function stable and patient connected to nasal cannula oxygen  Cardiovascular status: blood pressure returned to baseline and stable Postop Assessment: no apparent nausea or vomiting Anesthetic complications: no   No notable events documented.  Last Vitals:  Vitals:   04/19/24 1116 04/19/24 1408  BP: 134/81 130/77  Pulse: 95 83  Resp:    Temp: 36.6 C 36.8 C  SpO2: 95% 93%    Last Pain:  Vitals:   04/19/24 1420  TempSrc:   PainSc: 2                  Shivangi Lutz S

## 2024-04-22 NOTE — Op Note (Signed)
 NAME: Raymond Powell, Raymond Powell. MEDICAL RECORD NO: [id number] ACCOUNT NO: [id number] DATE OF BIRTH: [birth year] FACILITY: [facility] LOCATION: [facility] PHYSICIAN: Sena Clouatre C. Lorretta, MD  Operative Report   DATE OF PROCEDURE: 04/18/2024  PREOPERATIVE DIAGNOSIS:  Closed displaced fracture of the distal radius and distal ulna.  POSTOPERATIVE DIAGNOSIS:  Closed displaced fracture of the distal radius and distal ulna.  PROCEDURES PERFORMED:  Open reduction and internal fixation of the distal radius and open reduction internal fixation of the distal ulna.  SURGEON:  Dr. Lorretta.  ANESTHESIA:  General with an upper extremity block performed by Anesthesia.  ESTIMATED BLOOD LOSS:  Minimal.  SPECIMENS:  None.  COMPLICATIONS:  No acute complications.  INDICATIONS:  The patient is a pleasant 68 year old gentleman who fell on an outstretched hand sustaining closed fractures as mentioned above.  Risks, benefits, and alternatives of surgical fixation were discussed with him.  He agreed with this course of  action.  Consent was obtained.  DESCRIPTION OF PROCEDURE:  The patient received an upper extremity block in the preoperative holding area by anesthesia.  He was taken back to the operating room and placed supine on the operating room table.  The left upper extremity was prepped and  draped in the normal sterile fashion.  The tourniquet was used on the upper arm.  The arm was exsanguinated and the tourniquet was inflated to 250 mmHg.  An incision was made on the volar wrist overlying the FCR tendon.  Dissection was carried down  between the FCR tendon and the radial artery.  The pronator quadratus muscle was essentially obliterated due to the displacement and multiple fragments of the fracture.  The fracture was in multiple fragments intraarticular with a large radial styloid  component.  Some difficulty was had obtaining adequate reduction.  This was aided with the use of K-wire in the radial styloid holding this  fragment while appropriate size volar plate was chosen.  The plate was secured to the radial shaft with screws.   These were each measured and appropriate size screws were placed.  Following the distal radius screws were each drilled and appropriate size screws were placed.  K-wire was removed.  At one point, the radial styloid screws were removed because this  resulted in slight increased displacement of the styloid with the aid of a reduction clamp.  The radial styloid fragment was then compressed while new holes were drilled and new screws placed securing the radial styloid fragment better.  Afterwards,  x-rays were satisfactory.  Next, the distal ulna was evaluated.  It was brought into approximation with reducing the radius; however, still felt in need for fixation.  An incision overlying the fracture site was made.  Dissection was carefully taken down  to the ulna and fracture site.  This was cleaned off a little bit.  Care was taken to avoid the nerve.  The fracture was fairly easily reduced and a small ulnar plate was chosen.  Screws were placed in the ulnar shaft as well as the distal ulna, providing  near anatomic reduction and stable fixation of the ulna.  Both wounds were irrigated with saline solution and closed in layers, a deep layer with 3-0 Vicryl and the skin with a running subcuticular 4-0 Vicryl.  A splint was placed for stabilization.   When the tourniquet was released, the fingers turned to a nice pink color.    MUK D: 04/22/2024 8:37:45 am T: 04/22/2024 8:59:00 am  JOB: [id number]/ [id number]

## 2024-04-27 NOTE — Op Note (Signed)
 NAME: Raymond Powell, Raymond Powell. MEDICAL RECORD NO: [id number] ACCOUNT NO: [id number] DATE OF BIRTH: 15-May-1956 FACILITY: [facility] LOCATION: [facility] PHYSICIAN: Taishaun Levels C. Jonna Netter, MD  Operative Report   DATE OF PROCEDURE: 04/18/2024  PREOPERATIVE DIAGNOSIS:  Closed displaced complex fracture of the left distal radius and distal ulna.  POSTOPERATIVE DIAGNOSIS:  Closed displaced complex fracture of the left distal radius and distal ulna.  PROCEDURE PERFORMED: 1.  Open reduction and internal fixation of the distal radius with a volar plate. 2.  Open reduction and internal fixation of the distal ulna with an ulnar plate.  ANESTHESIA:  General with an upper extremity block  ESTIMATED BLOOD LOSS:  Minimal.  SPECIMENS:  None.  COMPLICATIONS:  No acute complications.  INDICATIONS:  The patient is a 68 year old gentleman who fell on outstretched hand, sustaining complex closed fractures as mentioned above.  Risks, benefits, and alternatives of surgery were discussed with him including his risk of bleeding while on  Eliquis .  The patient was admitted to the hospital overnight and taken to the operating room on the next morning for surgery.  Consent was obtained.  DESCRIPTION OF PROCEDURE:  The patient was placed supine on the operating room table where a timeout was performed.  Anesthesia was administered.  He received an upper extremity block by anesthesia in preoperative holding.  The left upper extremity was  prepped and draped in the normal sterile fashion.  A tourniquet was used on the upper arm.  The arm was exsanguinated and the tourniquet was inflated to 250 mmHg.  An incision on the volar wrist was made over the FCR tendon.  The interval between the FCR  tendon and radial artery was then used to dissect down to the fracture site.  The dissection was quite easy as the displaced fracture had disrupted the musculature from the fracture site.  The fracture was extremely comminuted in multiple, greater than   four fragments with a large amount of dorsal angulation and dorsal displacement.  Release of the first dorsal compartment was needed to enable the radial styloid fragment to be brought into approximation with the other radial fragments.  With the aid of  K-wires and fracture-reducing clamps, an adequate reduction was obtained.  Next, an appropriate size Acumed volar plate was chosen.  X-ray confirmed decent placement of the plate.  The plate had to be angulated a little bit to capture the most radial,  radial fragments.  The radial shaft screws were each drilled, measured, and appropriate size screws were placed here.  With the reduction held, the distal radial screws were each drilled, measured, and placed with locking screws.  Final x-ray confirmed a  re-displacement of the radial styloid and therefore the radial most screws were removed.  Further reduction was obtained and new holes were drilled and appropriate screws were placed, holding the radial styloid fragment much better than previous.  Final  x-rays revealed satisfactory reduction and plate and screw length.  Next, the distal ulna was addressed.  It was somewhat brought into approximation with reduction of the radius; however, stable fixation was felt necessary.  An incision overlying the  mid lateral portion of the ulna was made.  Dissection was carried down to the fracture site.  Care was taken to avoid the ulnar sensory nerve.  The fracture site was visualized.  It was fairly easily reduced with a reduction clamp and appropriate size  small ulnar plate was placed.  Appropriate size screws were drilled in the ulnar shaft as well as the  distal ulna, nicely reducing the fracture.  Final x-rays revealed near anatomic reduction of the ulna with good plate and screw fixation.  Next, both  wounds were irrigated.  Each wound was closed in layers, the deep fascial layer with 3-0 Vicryl and a subcuticular skin closure with 4-0 Vicryl.  Sterile dressings  were placed as well as splints.  The tourniquet was released.  The fingers returned to a  nice pink color.  The patient tolerated the procedure well and was taken to the recovery room stable.   SHW D: 04/19/2024 10:14:16 am T: 04/19/2024 10:56:00 am  JOB: [id number]/ [id number]

## 2024-06-09 ENCOUNTER — Encounter: Payer: Self-pay | Admitting: Cardiology

## 2024-07-15 ENCOUNTER — Other Ambulatory Visit: Payer: Self-pay | Admitting: Cardiology

## 2024-07-15 DIAGNOSIS — R002 Palpitations: Secondary | ICD-10-CM

## 2024-07-15 DIAGNOSIS — I480 Paroxysmal atrial fibrillation: Secondary | ICD-10-CM

## 2024-08-09 ENCOUNTER — Other Ambulatory Visit: Payer: Self-pay | Admitting: Cardiology

## 2024-09-09 ENCOUNTER — Other Ambulatory Visit: Payer: Self-pay | Admitting: Cardiology

## 2024-09-09 DIAGNOSIS — R002 Palpitations: Secondary | ICD-10-CM

## 2024-09-09 DIAGNOSIS — I480 Paroxysmal atrial fibrillation: Secondary | ICD-10-CM

## 2024-09-11 NOTE — Telephone Encounter (Signed)
 Prescription refill request for Eliquis  received. Indication:   A-Fib Last office visit:  09/09/2023 pt has an upcoming appt on 09/15/2024 Scr:  0.99 Age:  68 yrs. Weight: 136 kg  Pt meets protocol and pt's medication was sent to pt's pharmacy for 3 mos until appt on 09/15/2024

## 2024-09-15 ENCOUNTER — Ambulatory Visit: Attending: Physician Assistant | Admitting: Cardiology

## 2024-09-15 ENCOUNTER — Encounter: Payer: Self-pay | Admitting: Cardiology

## 2024-09-15 VITALS — BP 126/68 | HR 72 | Ht 73.0 in | Wt 287.0 lb

## 2024-09-15 DIAGNOSIS — D6869 Other thrombophilia: Secondary | ICD-10-CM

## 2024-09-15 DIAGNOSIS — I10 Essential (primary) hypertension: Secondary | ICD-10-CM

## 2024-09-15 DIAGNOSIS — I480 Paroxysmal atrial fibrillation: Secondary | ICD-10-CM | POA: Diagnosis not present

## 2024-09-15 DIAGNOSIS — G4733 Obstructive sleep apnea (adult) (pediatric): Secondary | ICD-10-CM

## 2024-09-15 NOTE — Patient Instructions (Signed)
 Medication Instructions:   Your physician recommends that you continue on your current medications as directed. Please refer to the Current Medication list given to you today.   *If you need a refill on your cardiac medications before your next appointment, please call your pharmacy*   Lab Work: NONE ORDERED  TODAY   If you have labs (blood work) drawn today and your tests are completely normal, you will receive your results only by: MyChart Message (if you have MyChart) OR A paper copy in the mail If you have any lab test that is abnormal or we need to change your treatment, we will call you to review the results.   Testing/Procedures:  NONE ORDERED  TODAY   Follow-Up: At [facility], you and your health needs are our priority.  As part of our continuing mission to provide you with exceptional heart care, our providers are all part of one team.  This team includes your primary Cardiologist (physician) and Advanced Practice Providers or APPs (Physician Assistants and Nurse Practitioners) who all work together to provide you with the care you need, when you need it.   Your next appointment:    6 month(s)   Provider:    You may see Will Gladis Norton, MD or one of the following Advanced Practice Providers on your designated Care Team:     We recommend signing up for the patient portal called MyChart.  Sign up information is provided on this After Visit Summary.  MyChart is used to connect with patients for Virtual Visits (Telemedicine).  Patients are able to view lab/test results, encounter notes, upcoming appointments, etc.  Non-urgent messages can be sent to your provider as well.   To learn more about what you can do with MyChart, go to [email or web address].   Other Instructions

## 2024-09-15 NOTE — Progress Notes (Signed)
  Electrophysiology Office Note:   Date:  09/15/2024  ID:  Raymond Powell, DOB 31-Jan-1956, MRN [id number]  Primary Cardiologist: Redell Leiter, MD Electrophysiologist: Will Gladis Norton, MD   Electrophysiologist:  Soyla Gladis Norton, MD  Sleep Medicine:  Wilbert Bihari, MD      History of Present Illness:   Raymond Powell is a 68 y.o. male with h/o atrial fibrillation and atrial flutter status post ablation, sleep apnea, obesity, diabetes seen today for routine electrophysiology followup.   S/P ablation December 2021. Following this, patient came off Flecainide  but had recurrent afib and Flecainide  restarted. Has continued on this since.  Since last being seen in our clinic the patient reports doing well overall on Flecainide  and Atenolol , no side effects noted. Reports one mechanical earlier in the year resulting in a fracture left wrist (slipped and fell after a rain shower).  he denies chest pain, palpitations, dyspnea, PND, orthopnea, nausea, vomiting, dizziness, syncope, edema, weight gain, or early satiety.   Review of systems complete and found to be negative unless listed in HPI.   EP Information / Studies Reviewed:    EKG is ordered today. Personal review as below.  EKG Interpretation Date/Time:  Tuesday September 15 2024 15:26:34 EST Ventricular Rate:  72 PR Interval:  198 QRS Duration:  90 QT Interval:  418 QTC Calculation: 457 R Axis:   -30  Text Interpretation: Normal sinus rhythm Left axis deviation Low voltage QRS When compared with ECG of 09-Sep-2023 14:38, No significant change was found Confirmed by Trudy [place] [phone]) on 09/15/2024 3:29:33 PM    Arrhythmia/Device History No specialty comments available.   Physical Exam:   VS:  BP 126/68   Pulse 72   Ht 6' 1 (1.854 m)   Wt 287 lb (130.2 kg)   SpO2 95%   BMI 37.87 kg/m    Wt Readings from Last 3 Encounters:  09/15/24 287 lb (130.2 kg)  04/19/24 299 lb 13.2 oz (136 kg)  10/29/23 300 lb (136.1 kg)      GEN: No acute distress NECK: No JVD; No carotid bruits CARDIAC: Regular rate and rhythm, no murmurs, rubs, gallops RESPIRATORY:  Clear to auscultation without rales, wheezing or rhonchi  ABDOMEN: Soft, non-tender, non-distended EXTREMITIES:  No edema; No deformity   ASSESSMENT AND PLAN:    Paroxysmal atrial fibrillation/flutter Secondary hypercoagulable state S/p ablation 10/14/2020. Continues to do well on Flecainide  and Atenolol , no symptoms suggestive of recurrent afib. ECG with stable intervals. Continue Flecainide  25mg  AM/ 50mg  PM Continue Atenolol  50mg  daily Continue Eliquis  5mg  BID  Hypertension BP well controlled today. Continue current regimen.  OSA Continue nightly use of CPAP.     Follow up with Dr. Norton in 6 months  Signed, [place] Trudy, PA-C

## 2024-11-15 ENCOUNTER — Other Ambulatory Visit: Payer: Self-pay | Admitting: Cardiology

## 2024-11-24 ENCOUNTER — Other Ambulatory Visit: Payer: Self-pay | Admitting: Cardiology

## 2024-11-24 DIAGNOSIS — R002 Palpitations: Secondary | ICD-10-CM

## 2024-11-24 DIAGNOSIS — I480 Paroxysmal atrial fibrillation: Secondary | ICD-10-CM

## 2024-11-25 NOTE — Telephone Encounter (Signed)
 In accordance with refill protocols, please review and address the following requirements before this medication refill can be authorized:  Labs

## 2024-11-27 ENCOUNTER — Encounter: Payer: Self-pay | Admitting: Cardiology

## 2024-12-01 ENCOUNTER — Other Ambulatory Visit: Payer: Self-pay | Admitting: Cardiology

## 2024-12-01 DIAGNOSIS — I480 Paroxysmal atrial fibrillation: Secondary | ICD-10-CM

## 2024-12-01 DIAGNOSIS — R002 Palpitations: Secondary | ICD-10-CM

## 2024-12-02 ENCOUNTER — Other Ambulatory Visit: Payer: Self-pay | Admitting: Cardiology

## 2024-12-02 DIAGNOSIS — I480 Paroxysmal atrial fibrillation: Secondary | ICD-10-CM

## 2024-12-02 DIAGNOSIS — R002 Palpitations: Secondary | ICD-10-CM

## 2024-12-08 MED ORDER — FLECAINIDE ACETATE 50 MG PO TABS: ORAL_TABLET | ORAL | 3 refills | Status: AC

## 2024-12-12 ENCOUNTER — Telehealth: Admitting: Family Medicine

## 2024-12-12 DIAGNOSIS — M549 Dorsalgia, unspecified: Secondary | ICD-10-CM

## 2024-12-12 NOTE — Progress Notes (Signed)
 Pt did not show for visit [facility]

## 2024-12-13 DIAGNOSIS — R079 Chest pain, unspecified: Secondary | ICD-10-CM

## 2025-01-08 ENCOUNTER — Ambulatory Visit: Admitting: Cardiology
